# Patient Record
Sex: Female | Born: 1955
Health system: Southern US, Community
[De-identification: ages and names within clinical notes are randomized; demographics above are authoritative.]

## PROBLEM LIST (undated history)

## (undated) DIAGNOSIS — K219 Gastro-esophageal reflux disease without esophagitis: Secondary | ICD-10-CM

## (undated) DIAGNOSIS — F32A Depression, unspecified: Secondary | ICD-10-CM

## (undated) DIAGNOSIS — T8859XA Other complications of anesthesia, initial encounter: Secondary | ICD-10-CM

## (undated) DIAGNOSIS — Z9289 Personal history of other medical treatment: Secondary | ICD-10-CM

## (undated) DIAGNOSIS — Z9889 Other specified postprocedural states: Secondary | ICD-10-CM

## (undated) DIAGNOSIS — R112 Nausea with vomiting, unspecified: Secondary | ICD-10-CM

## (undated) DIAGNOSIS — M1990 Unspecified osteoarthritis, unspecified site: Secondary | ICD-10-CM

## (undated) DIAGNOSIS — G479 Sleep disorder, unspecified: Secondary | ICD-10-CM

## (undated) DIAGNOSIS — K8020 Calculus of gallbladder without cholecystitis without obstruction: Secondary | ICD-10-CM

## (undated) DIAGNOSIS — A6000 Herpesviral infection of urogenital system, unspecified: Secondary | ICD-10-CM

## (undated) DIAGNOSIS — I10 Essential (primary) hypertension: Secondary | ICD-10-CM

## (undated) DIAGNOSIS — F329 Major depressive disorder, single episode, unspecified: Secondary | ICD-10-CM

## (undated) DIAGNOSIS — T4145XA Adverse effect of unspecified anesthetic, initial encounter: Secondary | ICD-10-CM

## (undated) DIAGNOSIS — J45909 Unspecified asthma, uncomplicated: Secondary | ICD-10-CM

## (undated) HISTORY — PX: PARTIAL HIP ARTHROPLASTY: SHX733

## (undated) HISTORY — PX: OTHER SURGICAL HISTORY: SHX169

---

## 1998-08-03 ENCOUNTER — Other Ambulatory Visit: Admission: RE | Admit: 1998-08-03 | Discharge: 1998-08-03 | Payer: Self-pay | Admitting: Obstetrics and Gynecology

## 1998-08-16 ENCOUNTER — Other Ambulatory Visit: Admission: RE | Admit: 1998-08-16 | Discharge: 1998-08-16 | Payer: Self-pay | Admitting: Obstetrics and Gynecology

## 1998-08-22 ENCOUNTER — Inpatient Hospital Stay (HOSPITAL_COMMUNITY): Admission: RE | Admit: 1998-08-22 | Discharge: 1998-08-25 | Payer: Self-pay | Admitting: Obstetrics and Gynecology

## 1998-08-27 ENCOUNTER — Inpatient Hospital Stay (HOSPITAL_COMMUNITY): Admission: AD | Admit: 1998-08-27 | Discharge: 1998-08-27 | Payer: Self-pay | Admitting: Obstetrics and Gynecology

## 1998-08-27 ENCOUNTER — Encounter: Payer: Self-pay | Admitting: Obstetrics and Gynecology

## 1999-04-03 HISTORY — PX: ABDOMINAL HYSTERECTOMY: SHX81

## 2001-09-25 ENCOUNTER — Other Ambulatory Visit: Admission: RE | Admit: 2001-09-25 | Discharge: 2001-09-25 | Payer: Self-pay | Admitting: Obstetrics & Gynecology

## 2002-06-30 ENCOUNTER — Encounter: Payer: Self-pay | Admitting: Emergency Medicine

## 2002-06-30 ENCOUNTER — Emergency Department (HOSPITAL_COMMUNITY): Admission: EM | Admit: 2002-06-30 | Discharge: 2002-06-30 | Payer: Self-pay | Admitting: Emergency Medicine

## 2003-04-03 HISTORY — PX: KNEE ARTHROSCOPY: SHX127

## 2003-09-15 ENCOUNTER — Encounter (HOSPITAL_COMMUNITY): Admission: RE | Admit: 2003-09-15 | Discharge: 2003-12-14 | Payer: Self-pay | Admitting: Endocrinology

## 2003-11-08 ENCOUNTER — Emergency Department (HOSPITAL_COMMUNITY): Admission: EM | Admit: 2003-11-08 | Discharge: 2003-11-08 | Payer: Self-pay | Admitting: Emergency Medicine

## 2005-04-02 HISTORY — PX: JOINT REPLACEMENT: SHX530

## 2005-08-28 ENCOUNTER — Inpatient Hospital Stay (HOSPITAL_COMMUNITY): Admission: RE | Admit: 2005-08-28 | Discharge: 2005-08-31 | Payer: Self-pay | Admitting: Orthopedic Surgery

## 2008-07-02 ENCOUNTER — Emergency Department (HOSPITAL_COMMUNITY): Admission: EM | Admit: 2008-07-02 | Discharge: 2008-07-03 | Payer: Self-pay | Admitting: Emergency Medicine

## 2008-08-22 ENCOUNTER — Emergency Department (HOSPITAL_COMMUNITY): Admission: EM | Admit: 2008-08-22 | Discharge: 2008-08-22 | Payer: Self-pay | Admitting: Emergency Medicine

## 2009-05-24 ENCOUNTER — Ambulatory Visit (HOSPITAL_COMMUNITY): Admission: RE | Admit: 2009-05-24 | Discharge: 2009-05-24 | Payer: Self-pay | Admitting: Family Medicine

## 2010-05-31 ENCOUNTER — Other Ambulatory Visit (HOSPITAL_COMMUNITY): Payer: Self-pay | Admitting: Family Medicine

## 2010-08-18 NOTE — Op Note (Signed)
Natasha Hunter, Natasha Hunter             ACCOUNT NO.:  [id number]   MEDICAL RECORD NO.:  [id number]          PATIENT TYPE:  INP   LOCATION:  X002                         FACILITY:  [facility]   PHYSICIAN:  Madlyn Frankel. Charlann Boxer, M.D.  DATE OF BIRTH:  [birth year]   DATE OF PROCEDURE:  08/28/2005  DATE OF DISCHARGE:                                 OPERATIVE REPORT   PREOPERATIVE DIAGNOSIS:  Right knee osteoarthritis.   POSTOPERATIVE DIAGNOSIS:  Right knee osteoarthritis.   PROCEDURE:  Right total knee replacement.   COMPONENTS USED:  DePuy rotating platform posterior stabilized knee system  with a size 2-1/2 femur, sized 2-1/2 tibia, 10 mm posterior stabilized  rotating platform polyethylene insert, and a 35 patella button.   SURGEON:  Madlyn Frankel. Charlann Boxer, M.D.   ASSISTANT:  Cherly Beach.   ANESTHESIA:  Dermal, spinal plus MAC.   DRAINS:  One.   COMPLICATIONS:  None.   TOURNIQUET TIME:  55 minutes at 250 mmHg.   INDICATION FOR PROCEDURE:  Natasha Hunter 55 year old female who I have been  treating for degenerative joint disease  bilaterally right worse than left.  She has failed all conservative measures and wished to proceed with surgical  intervention.  We have discussed risks and benefits of DVT infection,  component failure, need for revision, stiffness, postoperative rehab  protocol, et Karie Soda.  Consent obtained.   PROCEDURE IN DETAIL:  The patient was brought to the operative theater.  Once adequate anesthesia and preoperative antibiotics, 1 gram of Ancef, were  administered, the patient was positioned supine.  The proximal thigh  tourniquet was placed on the right hip.  The right lower extremity was then  prepped and draped in sterile fashion following a pre-scrub.  The midline  incision was made followed by modified medial parapatellar arthrotomy of a  tri-vector approach.  The patella was subluxated not everted.  Knee exposure  was obtained routinely including decompression of the  joint with osteophyte  debridement.   Femur exposure was obtained; excision of the cruciate ligaments was carried  out.  Intramedullary canal was opened in guide position.  My guide hole was  a little more central, so I cut at 4 degrees of valgus 10 mm of bone off the  distal femur.   Following this resection, I sized the femur to be a size 2.5.  The rotation  of the femur matched the epicondylar axis and perpendicular white side of  the line.  Given this, a size 2-1/2 block was positioned and anterior,  posterior and chamfer cuts made.  Box cut was then made off the lateral  aspect of the distal femur.   At this point, attention was directed to the tibia.  Tibial exposure was  obtained routinely including meniscectomies.  The tibia was subluxated  anteriorly with retractors placed.  Utilizing an extramedullary device, I  resected 10 mm of bone off the proximal medial tibia.  This was the low  side; they are symmetric.   Spacer blocks were placed, and the knee came out to full extension.   The trial components were position, including 2-1/2 femur, 2-1/2  tibia with  10-mm spacer.  With the knee in 90 degrees of flexion, I passed an alignment  rod indicating that the tibial cut was perpendicular with the center of the  alignment rod passing through the center of the ankle.  The slope of the  tibial cut appeared to be appropriate as well, perpendicular to the shaft.   Given these parameters, the tibial rotation was marked with a fixed bearing  prosthesis with a 1.8 mm shim in place.   At this point, the trial components were removed.  The final components were  brought to the field.  The knee was copiously irrigated with normal saline  solution with pulse lavage.  It was dried.  I then injected the knee with 60  mL of Marcaine with epinephrine and Toradol.  Once the components were  ready, cement was mixed on the back table.  The components were then  cemented in position with the  tibia and femur.  The patella was then done.  A 10-mm spacer was placed and the knee brought out in extension.  Excess  cement was removed.  Once the cement was curing, I flexed the knee, removed  the poly, cleaned out the back to the knee of any fragmented cement or any  loose debris that was identified.  The final 10-mm posterior stabilized  rotating platform polyethylene insert was then placed.  The knee was reduced  and stable.   At this point, the knee was again irrigated with normal saline solution.  There was minimally hemostasis required at this point.  A medium Hemovac  drain was placed deep.  The extensor mechanism was closed with #1 Vicryl  with the knee flexed at 90 degrees.  The remainder of the wound was closed  in layers with Monocryl for the skin.  The knee was cleaned, dried and  dressed sterilely with Steri-Strips, dressing sponges, tape and a ball bulky  dressing.  The patient was transferred to the recovery room in stable  condition.      Madlyn Frankel Charlann Boxer, M.D.  Electronically Signed     MDO/MEDQ  D:  08/28/2005  T:  08/28/2005  Job:  [id number]

## 2010-08-18 NOTE — Discharge Summary (Signed)
NAMEKERRIE, Natasha Hunter             ACCOUNT NO.:  [id number]   MEDICAL RECORD NO.:  [id number]          PATIENT TYPE:  INP   LOCATION:  1508                         FACILITY:  [facility]   PHYSICIAN:  Madlyn Frankel. Charlann Boxer, M.D.  DATE OF BIRTH:  13-Jun-1955   DATE OF ADMISSION:  08/28/2005  DATE OF DISCHARGE:  08/31/2005                                 DISCHARGE SUMMARY   ADMITTING DIAGNOSES:  1.  Osteoarthritis of right knee.  2.  Hypertension.   DISCHARGE DIAGNOSES:  1.  Osteoarthritis of right knee.  2.  Hypertension.  3.  Mild postoperative anemia.   PROCEDURE:  On Aug 28, 2005, the patient underwent right total knee  replacement arthroplasty utilizing the DePuy rotating platform posterior  stabilizing knee system, D. Ashley Akin PA-C assisted.   BRIEF HISTORY:  This 55 year old lady with degenerative joint disease  bilaterally in her knees worse on the right than the left.  All conservative  measures have failed prior to this decision to go ahead with surgical  intervention.  Dr. Charlann Boxer discussed the risks and benefits of surgery and was  decided to go ahead with the above procedure.   HOSPITAL COURSE:  The patient tolerated surgical procedure quite well and  did well with her physical therapy.  Weightbearing as tolerated.  Wound  remained clean and dry after surgery.  There was no critical incidents while  she was in the hospital well.  Home health was arranged and once this was in  place, it was felt she could be maintain in her home environment.  She was  discharged.   LABORATORY DATA AND X-RAY FINDINGS:  Laboratory values in the hospital  hematologically showed a preoperative CBC completely within normal limits,  hemoglobin 14.6, hematocrit 42.3.  Postoperatively, her final hemoglobin was  10.6, hematocrit 31.0.  Blood chemistries were normal.  Urinalysis negative  for urinary tract infection.   Electrocardiogram was sinus tachycardia with possible left atrial  enlargement.  No  chest x-ray seen on this chart.   CONDITION ON DISCHARGE:  Improved and stable.   DISPOSITION:  The patient is discharged to her home.  Continue with  weightbearing as tolerated.  Follow health instructions.  May shower in 4  days.   DISCHARGE MEDICATIONS:  1.  Vicodin for discomfort.  2.  Flexeril for muscle relaxant.  3.  One aspirin per day.   FOLLOW UP:  She will eventually go to outpatient physical therapy at  [facility].  She has actually decided this rather than home health and  we will certainly comply.      Dooley L. Cherlynn June.      Madlyn Frankel Charlann Boxer, M.D.  Electronically Signed    DLU/MEDQ  D:  09/11/2005  T:  09/11/2005  Job:  [id number]   cc:   Duncan Dull, M.D.  Fax: [phone]

## 2010-08-18 NOTE — H&P (Signed)
Natasha Hunter, Natasha Hunter             ACCOUNT NO.:  [id number]   MEDICAL RECORD NO.:  [id number]          PATIENT TYPE:  INP   LOCATION:  NA                           FACILITY:  [facility]   PHYSICIAN:  Natasha Hunter, M.D.  DATE OF BIRTH:  29-Sep-1955   DATE OF ADMISSION:  DATE OF DISCHARGE:                                HISTORY & PHYSICAL   DATE OF ADMISSION TO [facility]:  Aug 28, 2005   CHIEF COMPLAINT:  Pain in my right knee.   PRESENT ILLNESS:  This 55 year old female seen by [place] for concerns about her  right knee.  In November 2002 she was doing a exercise program in a gym and  felt a pop in her knee.  As she understood her diagnosis at that time, she  had an ACL tear and underwent surgery in 2003.  She recovered fairly well  from that, had on and off increasing pain into the knee; multiple  aspirations of the knee were done as well.  She is now having more and more  problems with the knee to the point where it is interfering with her day-to-  day activities.  X-rays have shown advanced degenerative changes to the  lateral compartment of the knee.  Anti-inflammatories have been used to help  her continuing knee pain as well as intra-articular cortisone injections.  Ice has been used as well.  Due to the fact that she has continued with  progressive problems concerning the knee and is a very active lady, it is  felt that she would benefit with surgical intervention and after the risks  and benefits of surgery been described to the patient, she agreed to go  ahead with total knee replacement arthroplasty of the right knee.   PAST MEDICAL HISTORY:  The patient is allergic to SULFA and has  hallucinations on DARVOCET.  Dr. Shaune Pollack is her primary care physician.   Current medications:  1.  Amitriptyline 10 mg a day.  2.  Hydrochlorothiazide 25 mg a day.  3.  Cartia 240 mg a day.  4.  Clonidine 0.1 mg a day.  5.  Lisinopril 40 mg daily.   She is being treated for  hypertension, some mild depression, and has a  remote history of asthma.  Past surgeries have been right knee surgeries as  mentioned above, hysterectomy in 2009.   FAMILY HISTORY:  Positive for cancer in her family.   SOCIAL HISTORY:  She is single.  She is a Airline pilot support person, has no  intake of tobacco products.  Has about a pint of alcohol a month, if that.  She has three children, lives in an apartment on the first floor, has no  definitive caregiver after surgery.   REVIEW OF SYSTEMS:  CNS:  No seizure disorder, paralysis, numbness, double  vision.  RESPIRATORY:  No productive cough, no hemoptysis, no shortness of  breath.  CARDIOVASCULAR:  No chest pain, no angina or orthopnea.  GASTROINTESTINAL:  No nausea, vomiting, melena, or bloody stools.  GENITOURINARY:  No discharge, dysuria, hematuria.  MUSCULOSKELETAL:  Primarily in present illness.  PHYSICAL EXAMINATION:  GENERAL:  Alert and cooperative, friendly 55 year old  female.  VITAL SIGNS:  Blood pressure 138/78, pulse 72, respirations 10.  HEENT:  Normocephalic, PERRLA, oropharynx is clear.  CHEST:  Clear to auscultation, no rhonchi, no rales.  HEART:  Regular rate and rhythm.  No murmurs are heard.  ABDOMEN:  Soft, nontender.  Liver and spleen not felt.  GENITALIA, RECTAL, PELVIC, BREAST:  Not done, not pertinent to present  illness.  EXTREMITIES:  Painful range of motion of the right knee with crepitus.   ADMISSION DIAGNOSES:  1.  Osteoarthritis right knee.  2.  Hypertension.   PLAN:  The patient will undergo right total knee replacement arthroplasty.  We will use CPM postoperatively and plan for home health.  Should we have  any medical problems we will certainly call Dr. Shaune Pollack or one of her  associates to follow along with [place] during this patient's hospitalization.   Note:  This history and physical was performed in our office on Aug 13, 2005.      Dooley L. Cherlynn June.      Natasha Frankel Charlann Hunter,  M.D.  Electronically Signed    DLU/MEDQ  D:  08/15/2005  T:  08/15/2005  Job:  [id number]   cc:   Duncan Dull, M.D.  Fax: [phone]

## 2010-09-05 ENCOUNTER — Other Ambulatory Visit (HOSPITAL_COMMUNITY): Payer: Self-pay | Admitting: Family Medicine

## 2010-09-05 DIAGNOSIS — Z1231 Encounter for screening mammogram for malignant neoplasm of breast: Secondary | ICD-10-CM

## 2010-09-14 ENCOUNTER — Ambulatory Visit (HOSPITAL_COMMUNITY)
Admission: RE | Admit: 2010-09-14 | Discharge: 2010-09-14 | Disposition: A | Discharge status: Home / Self Care | Payer: Self-pay | Source: Ambulatory Visit | Attending: Family Medicine | Admitting: Family Medicine

## 2010-09-14 DIAGNOSIS — Z1231 Encounter for screening mammogram for malignant neoplasm of breast: Secondary | ICD-10-CM

## 2011-08-31 ENCOUNTER — Other Ambulatory Visit (HOSPITAL_COMMUNITY): Payer: Self-pay | Admitting: Family Medicine

## 2011-12-27 ENCOUNTER — Other Ambulatory Visit (HOSPITAL_COMMUNITY): Payer: Self-pay | Admitting: Family Medicine

## 2011-12-27 DIAGNOSIS — Z1231 Encounter for screening mammogram for malignant neoplasm of breast: Secondary | ICD-10-CM

## 2012-01-11 ENCOUNTER — Ambulatory Visit (HOSPITAL_COMMUNITY)
Admission: RE | Admit: 2012-01-11 | Discharge: 2012-01-11 | Disposition: A | Discharge status: Home / Self Care | Payer: Self-pay | Source: Ambulatory Visit | Attending: Family Medicine | Admitting: Family Medicine

## 2012-01-11 DIAGNOSIS — Z1231 Encounter for screening mammogram for malignant neoplasm of breast: Secondary | ICD-10-CM

## 2012-11-17 ENCOUNTER — Inpatient Hospital Stay: Admit: 2012-11-17 | Payer: Self-pay | Admitting: Orthopedic Surgery

## 2012-11-17 SURGERY — ARTHROPLASTY, KNEE, TOTAL
Anesthesia: Spinal | Site: Knee | Laterality: Left

## 2012-12-09 ENCOUNTER — Other Ambulatory Visit (HOSPITAL_COMMUNITY): Payer: Self-pay | Admitting: Family Medicine

## 2012-12-09 DIAGNOSIS — Z1231 Encounter for screening mammogram for malignant neoplasm of breast: Secondary | ICD-10-CM

## 2013-01-12 ENCOUNTER — Ambulatory Visit (HOSPITAL_COMMUNITY)
Admission: RE | Admit: 2013-01-12 | Discharge: 2013-01-12 | Disposition: A | Discharge status: Home / Self Care | Payer: Self-pay | Source: Ambulatory Visit | Attending: Family Medicine | Admitting: Family Medicine

## 2013-01-12 DIAGNOSIS — Z1231 Encounter for screening mammogram for malignant neoplasm of breast: Secondary | ICD-10-CM

## 2014-01-14 ENCOUNTER — Other Ambulatory Visit (HOSPITAL_COMMUNITY): Payer: Self-pay | Admitting: Family Medicine

## 2014-01-14 DIAGNOSIS — Z1231 Encounter for screening mammogram for malignant neoplasm of breast: Secondary | ICD-10-CM

## 2014-01-27 ENCOUNTER — Ambulatory Visit (HOSPITAL_COMMUNITY)
Admission: RE | Admit: 2014-01-27 | Discharge: 2014-01-27 | Disposition: A | Discharge status: Home / Self Care | Payer: BC Managed Care – PPO | Source: Ambulatory Visit | Attending: Family Medicine | Admitting: Family Medicine

## 2014-01-27 DIAGNOSIS — Z1231 Encounter for screening mammogram for malignant neoplasm of breast: Secondary | ICD-10-CM | POA: Insufficient documentation

## 2014-05-04 NOTE — Progress Notes (Signed)
Please put orders in Epic surgery 05-17-14 pre op 05-10-14 Thanks 

## 2014-05-07 NOTE — Patient Instructions (Addendum)
Natasha HarrisonDeborah A Hunter  05/07/2014   Your procedure is scheduled on: 05/17/14   [facility] and follow signs to               Short Stay Center at 10:15 AM.   Call this number if you have problems the morning of surgery [phone]   Remember:  Do not eat food or drink liquids :After Midnight.     Take these medicines the morning of surgery with A SIP OF WATER: ATENOLOL / CLONIDINE                               You may not have any metal on your body including hair pins and              piercings  Do not wear jewelry, make-up, lotions, powders or perfumes.             Do not wear nail polish.  Do not shave  48 hours prior to surgery.              Men may shave face and neck.   Do not bring valuables to the hospital. [name] IS NOT             RESPONSIBLE   FOR VALUABLES.  Contacts, dentures or bridgework may not be worn into surgery.  Leave suitcase in the car. After surgery it may be brought to your room.     Patients discharged the day of surgery will not be allowed to drive home.  Name and phone number of your driver:  Special Instructions: N/A              Please read over the following fact sheets you were given: _____________________________________________________________________                                                     [name] - PREPARING FOR SURGERY  Before surgery, you can play an important role.  Because skin is not sterile, your skin needs to be as free of germs as possible.  You can reduce the number of germs on your skin by washing with CHG (chlorahexidine gluconate) soap before surgery.  CHG is an antiseptic cleaner which kills germs and bonds with the skin to continue killing germs even after washing. Please DO NOT use if you have an allergy to CHG or antibacterial soaps.  If your skin becomes reddened/irritated stop using the CHG and inform your nurse when you arrive at Short Stay. Do not shave  (including legs and underarms) for at least 48 hours prior to the first CHG shower.  You may shave your face. Please follow these instructions carefully:   1.  Shower with CHG Soap the night before surgery and the  morning of Surgery.   2.  If you choose to wash your hair, wash your hair first as usual with your  normal  Shampoo.   3.  After you shampoo, rinse your hair and body thoroughly to remove the  shampoo.  4.  Use CHG as you would any other liquid soap.  You can apply chg directly  to the skin and wash . Gently wash with scrungie or clean wascloth    5.  Apply the CHG Soap to your body ONLY FROM THE NECK DOWN.   Do not use on open                           Wound or open sores. Avoid contact with eyes, ears mouth and genitals (private parts).                        Genitals (private parts) with your normal soap.              6.  Wash thoroughly, paying special attention to the area where your surgery  will be performed.   7.  Thoroughly rinse your body with warm water from the neck down.   8.  DO NOT shower/wash with your normal soap after using and rinsing off  the CHG Soap .                9.  Pat yourself dry with a clean towel.             10.  Wear clean pajamas.             11.  Place clean sheets on your bed the night of your first shower and do not  sleep with pets.  Day of Surgery : Do not apply any lotions/deodorants the morning of surgery.  Please wear clean clothes to the hospital/surgery center.  FAILURE TO FOLLOW THESE INSTRUCTIONS MAY RESULT IN THE CANCELLATION OF YOUR SURGERY    PATIENT SIGNATURE_________________________________  ______________________________________________________________________     Natasha Hunter  An incentive spirometer is a tool that can help keep your lungs clear and active. This tool measures how well you are filling your lungs with each breath. Taking long deep breaths may help  reverse or decrease the chance of developing breathing (pulmonary) problems (especially infection) following:  A long period of time when you are unable to move or be active. BEFORE THE PROCEDURE   If the spirometer includes an indicator to show your best effort, your nurse or respiratory therapist will set it to a desired goal.  If possible, sit up straight or lean slightly forward. Try not to slouch.  Hold the incentive spirometer in an upright position. INSTRUCTIONS FOR USE   Sit on the edge of your bed if possible, or sit up as far as you can in bed or on a chair.  Hold the incentive spirometer in an upright position.  Breathe out normally.  Place the mouthpiece in your mouth and seal your lips tightly around it.  Breathe in slowly and as deeply as possible, raising the piston or the ball toward the top of the column.  Hold your breath for 3-5 seconds or for as long as possible. Allow the piston or ball to fall to the bottom of the column.  Remove the mouthpiece from your mouth and breathe out normally.  Rest for a few seconds and repeat Steps 1 through 7 at least 10 times every 1-2 hours when you are awake. Take your time and take a few normal breaths between deep breaths.  The spirometer may include an indicator to show your best effort. Use the indicator as a goal to work toward during  each repetition.  After each set of 10 deep breaths, practice coughing to be sure your lungs are clear. If you have an incision (the cut made at the time of surgery), support your incision when coughing by placing a pillow or rolled up towels firmly against it. Once you are able to get out of bed, walk around indoors and cough well. You may stop using the incentive spirometer when instructed by your caregiver.  RISKS AND COMPLICATIONS  Take your time so you do not get dizzy or light-headed.  If you are in pain, you may need to take or ask for pain medication before doing incentive spirometry.  It is harder to take a deep breath if you are having pain. AFTER USE  Rest and breathe slowly and easily.  It can be helpful to keep track of a log of your progress. Your caregiver can provide you with a simple table to help with this. If you are using the spirometer at home, follow these instructions: [place] IF:   You are having difficultly using the spirometer.  You have trouble using the spirometer as often as instructed.  Your pain medication is not giving enough relief while using the spirometer.  You develop fever of 100.5 F (38.1 C) or higher. SEEK IMMEDIATE MEDICAL CARE IF:   You cough up bloody sputum that had not been present before.  You develop fever of 102 F (38.9 C) or greater.  You develop worsening pain at or near the incision site. MAKE SURE YOU:   Understand these instructions.  Will watch your condition.  Will get help right away if you are not doing well or get worse. Document Released: 07/30/2006 Document Revised: 06/11/2011 Document Reviewed: 09/30/2006 Exit[employer] Patient Information 2014 Exit[employer], [place].   ________________________________________________________________________  WHAT IS A BLOOD TRANSFUSION? Blood Transfusion Information  A transfusion is the replacement of blood or some of its parts. Blood is made up of multiple cells which provide different functions.  Red blood cells carry oxygen and are used for blood loss replacement.  White blood cells fight against infection.  Platelets control bleeding.  Plasma helps clot blood.  Other blood products are available for specialized needs, such as hemophilia or other clotting disorders. BEFORE THE TRANSFUSION  Who gives blood for transfusions?   Healthy volunteers who are fully evaluated to make sure their blood is safe. This is blood bank blood. Transfusion therapy is the safest it has ever been in the practice of medicine. Before blood is taken from a donor, a complete  history is taken to make sure that person has no history of diseases nor engages in risky social behavior (examples are intravenous drug use or sexual activity with multiple partners). The donor's travel history is screened to minimize risk of transmitting infections, such as malaria. The donated blood is tested for signs of infectious diseases, such as HIV and hepatitis. The blood is then tested to be sure it is compatible with you in order to minimize the chance of a transfusion reaction. If you or a relative donates blood, this is often done in anticipation of surgery and is not appropriate for emergency situations. It takes many days to process the donated blood. RISKS AND COMPLICATIONS Although transfusion therapy is very safe and saves many lives, the main dangers of transfusion include:   Getting an infectious disease.  Developing a transfusion reaction. This is an allergic reaction to something in the blood you were given. Every precaution is taken to prevent  this. The decision to have a blood transfusion has been considered carefully by your caregiver before blood is given. Blood is not given unless the benefits outweigh the risks. AFTER THE TRANSFUSION  Right after receiving a blood transfusion, you will usually feel much better and more energetic. This is especially true if your red blood cells have gotten low (anemic). The transfusion raises the level of the red blood cells which carry oxygen, and this usually causes an energy increase.  The nurse administering the transfusion will monitor you carefully for complications. HOME CARE INSTRUCTIONS  No special instructions are needed after a transfusion. You may find your energy is better. Speak with your caregiver about any limitations on activity for underlying diseases you may have. SEEK MEDICAL CARE IF:   Your condition is not improving after your transfusion.  You develop redness or irritation at the intravenous (IV) site. SEEK  IMMEDIATE MEDICAL CARE IF:  Any of the following symptoms occur over the next 12 hours:  Shaking chills.  You have a temperature by mouth above 102 F (38.9 C), not controlled by medicine.  Chest, back, or muscle pain.  People around you feel you are not acting correctly or are confused.  Shortness of breath or difficulty breathing.  Dizziness and fainting.  You get a rash or develop hives.  You have a decrease in urine output.  Your urine turns a dark color or changes to pink, red, or brown. Any of the following symptoms occur over the next 10 days:  You have a temperature by mouth above 102 F (38.9 C), not controlled by medicine.  Shortness of breath.  Weakness after normal activity.  The white part of the eye turns yellow (jaundice).  You have a decrease in the amount of urine or are urinating less often.  Your urine turns a dark color or changes to pink, red, or brown. Document Released: 03/16/2000 Document Revised: 06/11/2011 Document Reviewed: 11/03/2007 [facility] Patient Information 2014 [place], [place].  _______________________________________________________________________

## 2014-05-10 ENCOUNTER — Encounter (HOSPITAL_COMMUNITY): Payer: Self-pay

## 2014-05-10 ENCOUNTER — Encounter (HOSPITAL_COMMUNITY)
Admission: RE | Admit: 2014-05-10 | Discharge: 2014-05-10 | Disposition: A | Discharge status: Home / Self Care | Payer: BLUE CROSS/BLUE SHIELD | Source: Ambulatory Visit | Attending: Orthopedic Surgery | Admitting: Orthopedic Surgery

## 2014-05-10 DIAGNOSIS — M179 Osteoarthritis of knee, unspecified: Secondary | ICD-10-CM | POA: Diagnosis not present

## 2014-05-10 DIAGNOSIS — Z01818 Encounter for other preprocedural examination: Secondary | ICD-10-CM | POA: Insufficient documentation

## 2014-05-10 HISTORY — DX: Other complications of anesthesia, initial encounter: T88.59XA

## 2014-05-10 HISTORY — DX: Sleep disorder, unspecified: G47.9

## 2014-05-10 HISTORY — DX: Nausea with vomiting, unspecified: R11.2

## 2014-05-10 HISTORY — DX: Unspecified asthma, uncomplicated: J45.909

## 2014-05-10 HISTORY — DX: Personal history of other medical treatment: Z92.89

## 2014-05-10 HISTORY — DX: Depression, unspecified: F32.A

## 2014-05-10 HISTORY — DX: Unspecified osteoarthritis, unspecified site: M19.90

## 2014-05-10 HISTORY — DX: Herpesviral infection of urogenital system, unspecified: A60.00

## 2014-05-10 HISTORY — DX: Adverse effect of unspecified anesthetic, initial encounter: T41.45XA

## 2014-05-10 HISTORY — DX: Major depressive disorder, single episode, unspecified: F32.9

## 2014-05-10 HISTORY — DX: Nausea with vomiting, unspecified: Z98.890

## 2014-05-10 HISTORY — DX: Essential (primary) hypertension: I10

## 2014-05-10 LAB — BASIC METABOLIC PANEL
Anion gap: 7 (ref 5–15)
BUN: 18 mg/dL (ref 6–23)
CALCIUM: 9.2 mg/dL (ref 8.4–10.5)
CHLORIDE: 102 mmol/L (ref 96–112)
CO2: 29 mmol/L (ref 19–32)
CREATININE: 0.95 mg/dL (ref 0.50–1.10)
GFR calc Af Amer: 75 mL/min — ABNORMAL LOW (ref >90)
GFR, EST NON AFRICAN AMERICAN: 65 mL/min — AB (ref >90)
Glucose, Bld: 89 mg/dL (ref 70–99)
POTASSIUM: 3.9 mmol/L (ref 3.5–5.1)
Sodium: 138 mmol/L (ref 135–145)

## 2014-05-10 LAB — CBC
HEMATOCRIT: 39.9 % (ref 36.0–46.0)
Hemoglobin: 13.1 g/dL (ref 12.0–15.0)
MCH: 30.3 pg (ref 26.0–34.0)
MCHC: 32.8 g/dL (ref 30.0–36.0)
MCV: 92.1 fL (ref 78.0–100.0)
Platelets: 250 10*3/uL (ref 150–400)
RBC: 4.33 MIL/uL (ref 3.87–5.11)
RDW: 12.7 % (ref 11.5–15.5)
WBC: 7.7 10*3/uL (ref 4.0–10.5)

## 2014-05-10 LAB — URINALYSIS, ROUTINE W REFLEX MICROSCOPIC
BILIRUBIN URINE: NEGATIVE
GLUCOSE, UA: NEGATIVE mg/dL
Ketones, ur: NEGATIVE mg/dL
LEUKOCYTES UA: NEGATIVE
Nitrite: NEGATIVE
PH: 5.5 (ref 5.0–8.0)
Protein, ur: NEGATIVE mg/dL
SPECIFIC GRAVITY, URINE: 1.015 (ref 1.005–1.030)
UROBILINOGEN UA: 0.2 mg/dL (ref 0.0–1.0)

## 2014-05-10 LAB — SURGICAL PCR SCREEN
MRSA, PCR: INVALID — AB
STAPHYLOCOCCUS AUREUS: INVALID — AB

## 2014-05-10 LAB — URINE MICROSCOPIC-ADD ON

## 2014-05-10 LAB — APTT: aPTT: 31 seconds (ref 24–37)

## 2014-05-10 LAB — PROTIME-INR
INR: 1.04 (ref 0.00–1.49)
PROTHROMBIN TIME: 13.8 s (ref 11.6–15.2)

## 2014-05-10 NOTE — Progress Notes (Signed)
Micro, ua results faxed to dr Charlann Boxerolin office by epic

## 2014-05-12 NOTE — H&P (Signed)
TOTAL KNEE ADMISSION H&P  Patient is being admitted for left total knee arthroplasty.  Subjective:  Chief Complaint:    Left knee primary OA /pain.  HPI: Natasha Hunter, 59 y.o. female, has a history of pain and functional disability in the left knee due to arthritis and has failed non-surgical conservative treatments for greater than 12 weeks.  She has had a history of right total knee arthroplasty three or four years ago that she has done well with. She recognizes at this point that her left knee is persistently bothersome and at this point ready to proceed with total knee arthroplasty. She has no desire to continue conservative measures based on the persistence of her symptoms.  On examination, she is seen and evaluated in the office. Today, she is a very pleasant female in no acute distress. She walks with a slight limp favoring this left knee with a valgus tendency of the left knee with no significant fixed flexion or valgus deformity. Her right knee incision is healed with full knee extension and flexion. She is tolerating this with stable ligaments with no signs of infection.  Radiographs in the office today, an AP standing of both knees and a standing lateral of the left knee reveal advanced degenerative joint changes in the left knee with complete loss of joint space and patellofemoral lateral compartments with tricompartment osteophytes.  Based on her persistent discomfort and lack of response to previous treatment, she desires at this point to proceed with total knee arthroplasty. I reviewed the risks and benefits of the procedure, the postoperative course, and expectations of this as she has been through this in the past. Surgery will be scheduled through [facility] based on her request. We will see her in the postoperative period. We stressed the postoperative recovery and she is aware of this based on her previous surgery.   PCP: Hollice Espy, MD  D/C Plans:      Home  with HHPT  Post-op Meds:       No Rx given  Tranexamic Acid:      To be given - IV  Decadron:      Is to be given  FYI:     ASA post-op  Norco post-op     Past Medical History  Diagnosis Date  . Complication of anesthesia   . PONV (postoperative nausea and vomiting)   . Hypertension   . Asthma     INFFREQUENT PROBLEMS - DOES NOT HAVE INHALER  . Arthritis   . Genital herpes   . History of transfusion   . Difficulty sleeping   . Depression     Past Surgical History  Procedure Laterality Date  . Knee arthroscopy  2005    rt knee  . Joint replacement  2007    RT KNEE  . Abdominal hysterectomy  2001  . Aborations      X2    No prescriptions prior to admission   Allergies  Allergen Reactions  . Darvon [Propoxyphene]     hallucinations  . Sulfa Antibiotics Other (See Comments)    Large welps    History  Substance Use Topics  . Smoking status: Never Smoker   . Smokeless tobacco: Not on file  . Alcohol Use: No    No family history on file.   Review of Systems  Constitutional: Negative.   HENT: Negative.   Eyes: Negative.   Respiratory: Negative.   Cardiovascular: Negative.   Gastrointestinal: Negative.   Genitourinary: Negative.  Musculoskeletal: Positive for joint pain.  Skin: Negative.   Neurological: Negative.   Endo/Heme/Allergies: Negative.   Psychiatric/Behavioral: Positive for depression. The patient has insomnia.     Objective:  Physical Exam  Constitutional: She is oriented to person, place, and time. She appears well-developed and well-nourished.  HENT:  Head: Normocephalic and atraumatic.  Eyes: Pupils are equal, round, and reactive to light.  Neck: Neck supple. No JVD present. No tracheal deviation present. No thyromegaly present.  Cardiovascular: Normal rate, regular rhythm, normal heart sounds and intact distal pulses.   Respiratory: Effort normal and breath sounds normal. No stridor. No respiratory distress. She has no wheezes.  GI:  Soft. There is no tenderness. There is no guarding.  Musculoskeletal:       Left knee: She exhibits decreased range of motion, swelling and bony tenderness. She exhibits no ecchymosis, no deformity, no laceration and no erythema. Tenderness found.  Lymphadenopathy:    She has no cervical adenopathy.  Neurological: She is alert and oriented to person, place, and time.  Skin: Skin is warm and dry.  Psychiatric: She has a normal mood and affect.      Imaging Review Plain radiographs demonstrate severe degenerative joint disease of the left knee(s). The overall alignment is neutral. The bone quality appears to be good for age and reported activity level.  Assessment/Plan:  End stage arthritis, left knee   The patient history, physical examination, clinical judgment of the provider and imaging studies are consistent with end stage degenerative joint disease of the left knee(s) and total knee arthroplasty is deemed medically necessary. The treatment options including medical management, injection therapy arthroscopy and arthroplasty were discussed at length. The risks and benefits of total knee arthroplasty were presented and reviewed. The risks due to aseptic loosening, infection, stiffness, patella tracking problems, thromboembolic complications and other imponderables were discussed. The patient acknowledged the explanation, agreed to proceed with the plan and consent was signed. Patient is being admitted for inpatient treatment for surgery, pain control, PT, OT, prophylactic antibiotics, VTE prophylaxis, progressive ambulation and ADL's and discharge planning. The patient is planning to be discharged home with home health services.     Anastasio AuerbachMatthew S. Yulian Gosney   PA-C  05/12/2014, 9:42 PM

## 2014-05-13 LAB — MRSA CULTURE

## 2014-05-15 NOTE — Anesthesia Preprocedure Evaluation (Signed)
Anesthesia Evaluation  Patient identified by MRN, date of birth, ID band Patient awake    Reviewed: Allergy & Precautions, NPO status , Patient's Chart, lab work & pertinent test results  History of Anesthesia Complications (+) history of anesthetic complications  Airway Mallampati: II  TM Distance: >3 FB Neck ROM: Full    Dental no notable dental hx. (+) Dental Advisory Given   Pulmonary asthma ,  breath sounds clear to auscultation  Pulmonary exam normal       Cardiovascular hypertension, Pt. on medications Rhythm:Regular Rate:Normal     Neuro/Psych PSYCHIATRIC DISORDERS Anxiety Depression negative neurological ROS     GI/Hepatic negative GI ROS, Neg liver ROS,   Endo/Other  negative endocrine ROS  Renal/GU negative Renal ROS  negative genitourinary   Musculoskeletal  (+) Arthritis -, Osteoarthritis,    Abdominal   Peds negative pediatric ROS (+)  Hematology negative hematology ROS (+)   Anesthesia Other Findings   Reproductive/Obstetrics negative OB ROS                             Anesthesia Physical Anesthesia Plan  ASA: II  Anesthesia Plan: Spinal   Post-op Pain Management:    Induction:   Airway Management Planned:   Additional Equipment:   Intra-op Plan:   Post-operative Plan:   Informed Consent: I have reviewed the patients History and Physical, chart, labs and discussed the procedure including the risks, benefits and alternatives for the proposed anesthesia with the patient or authorized representative who has indicated his/her understanding and acceptance.   Dental advisory given  Plan Discussed with: CRNA  Anesthesia Plan Comments:         Anesthesia Quick Evaluation

## 2014-05-17 ENCOUNTER — Encounter (HOSPITAL_COMMUNITY)
Admission: RE | Disposition: A | Discharge status: Home Health | Payer: Self-pay | Source: Ambulatory Visit | Attending: Orthopedic Surgery

## 2014-05-17 ENCOUNTER — Inpatient Hospital Stay (HOSPITAL_COMMUNITY): Payer: BLUE CROSS/BLUE SHIELD | Admitting: Anesthesiology

## 2014-05-17 ENCOUNTER — Inpatient Hospital Stay (HOSPITAL_COMMUNITY)
Admission: RE | Admit: 2014-05-17 | Discharge: 2014-05-18 | DRG: 470 | Disposition: A | Discharge status: Home Health | Payer: BLUE CROSS/BLUE SHIELD | Source: Ambulatory Visit | Attending: Orthopedic Surgery | Admitting: Orthopedic Surgery

## 2014-05-17 ENCOUNTER — Encounter (HOSPITAL_COMMUNITY): Payer: Self-pay | Admitting: *Deleted

## 2014-05-17 DIAGNOSIS — Z01812 Encounter for preprocedural laboratory examination: Secondary | ICD-10-CM

## 2014-05-17 DIAGNOSIS — M659 Synovitis and tenosynovitis, unspecified: Secondary | ICD-10-CM | POA: Diagnosis present

## 2014-05-17 DIAGNOSIS — Z96659 Presence of unspecified artificial knee joint: Secondary | ICD-10-CM

## 2014-05-17 DIAGNOSIS — M1712 Unilateral primary osteoarthritis, left knee: Principal | ICD-10-CM | POA: Diagnosis present

## 2014-05-17 DIAGNOSIS — Z96651 Presence of right artificial knee joint: Secondary | ICD-10-CM | POA: Diagnosis present

## 2014-05-17 DIAGNOSIS — Z96652 Presence of left artificial knee joint: Secondary | ICD-10-CM

## 2014-05-17 DIAGNOSIS — I10 Essential (primary) hypertension: Secondary | ICD-10-CM | POA: Diagnosis present

## 2014-05-17 DIAGNOSIS — M25562 Pain in left knee: Secondary | ICD-10-CM | POA: Diagnosis present

## 2014-05-17 HISTORY — PX: TOTAL KNEE ARTHROPLASTY: SHX125

## 2014-05-17 LAB — TYPE AND SCREEN
ABO/RH(D): A POS
ANTIBODY SCREEN: NEGATIVE

## 2014-05-17 SURGERY — ARTHROPLASTY, KNEE, TOTAL
Anesthesia: Spinal | Site: Knee | Laterality: Left

## 2014-05-17 MED ORDER — SODIUM CHLORIDE 0.9 % IJ SOLN
INTRAMUSCULAR | Status: DC | PRN
  Administered 2014-05-17: 30 mL

## 2014-05-17 MED ORDER — PROPOFOL 10 MG/ML IV BOLUS
INTRAVENOUS | Status: AC
  Filled 2014-05-17: qty 20

## 2014-05-17 MED ORDER — DEXAMETHASONE SODIUM PHOSPHATE 10 MG/ML IJ SOLN
INTRAMUSCULAR | Status: AC
  Filled 2014-05-17: qty 1

## 2014-05-17 MED ORDER — SODIUM CHLORIDE 0.9 % IR SOLN
Status: DC | PRN
  Administered 2014-05-17: 1000 mL

## 2014-05-17 MED ORDER — FENTANYL CITRATE 0.05 MG/ML IJ SOLN
INTRAMUSCULAR | Status: AC
  Filled 2014-05-17: qty 2

## 2014-05-17 MED ORDER — HYDROMORPHONE HCL 1 MG/ML IJ SOLN
0.5000 mg | INTRAMUSCULAR | Status: DC | PRN
  Administered 2014-05-17 – 2014-05-18 (×4): 1 mg via INTRAVENOUS
  Filled 2014-05-17: qty 2
  Filled 2014-05-17 (×3): qty 1

## 2014-05-17 MED ORDER — ONDANSETRON HCL 4 MG/2ML IJ SOLN: 4.0000 mg | Freq: Once | INTRAMUSCULAR | Status: DC | PRN

## 2014-05-17 MED ORDER — HYDROMORPHONE HCL 1 MG/ML IJ SOLN
INTRAMUSCULAR | Status: AC
  Filled 2014-05-17: qty 1

## 2014-05-17 MED ORDER — MENTHOL 3 MG MT LOZG
1.0000 | LOZENGE | OROMUCOSAL | Status: DC | PRN
  Filled 2014-05-17: qty 9

## 2014-05-17 MED ORDER — CEFAZOLIN SODIUM-DEXTROSE 2-3 GM-% IV SOLR
2.0000 g | INTRAVENOUS | Status: AC
  Administered 2014-05-17: 2 g via INTRAVENOUS

## 2014-05-17 MED ORDER — BISACODYL 10 MG RE SUPP: 10.0000 mg | Freq: Every day | RECTAL | Status: DC | PRN

## 2014-05-17 MED ORDER — CEFAZOLIN SODIUM-DEXTROSE 2-3 GM-% IV SOLR
INTRAVENOUS | Status: AC
  Filled 2014-05-17: qty 50

## 2014-05-17 MED ORDER — ASPIRIN EC 325 MG PO TBEC
325.0000 mg | DELAYED_RELEASE_TABLET | Freq: Two times a day (BID) | ORAL | Status: DC
  Administered 2014-05-18: 325 mg via ORAL
  Filled 2014-05-17 (×3): qty 1

## 2014-05-17 MED ORDER — 0.9 % SODIUM CHLORIDE (POUR BTL) OPTIME
TOPICAL | Status: DC | PRN
  Administered 2014-05-17: 1000 mL

## 2014-05-17 MED ORDER — ACETAMINOPHEN 10 MG/ML IV SOLN
1000.0000 mg | Freq: Once | INTRAVENOUS | Status: AC
  Administered 2014-05-17: 1000 mg via INTRAVENOUS
  Filled 2014-05-17: qty 100

## 2014-05-17 MED ORDER — TRAZODONE HCL 50 MG PO TABS
50.0000 mg | ORAL_TABLET | Freq: Every evening | ORAL | Status: DC | PRN
  Administered 2014-05-17: 150 mg via ORAL
  Filled 2014-05-17: qty 3

## 2014-05-17 MED ORDER — METOCLOPRAMIDE HCL 5 MG/ML IJ SOLN: 5.0000 mg | Freq: Three times a day (TID) | INTRAMUSCULAR | Status: DC | PRN

## 2014-05-17 MED ORDER — ONDANSETRON HCL 4 MG/2ML IJ SOLN
INTRAMUSCULAR | Status: DC | PRN
  Administered 2014-05-17: 4 mg via INTRAVENOUS

## 2014-05-17 MED ORDER — FENTANYL CITRATE 0.05 MG/ML IJ SOLN
25.0000 ug | INTRAMUSCULAR | Status: DC | PRN
  Administered 2014-05-17: 50 ug via INTRAVENOUS
  Administered 2014-05-17: 25 ug via INTRAVENOUS
  Administered 2014-05-17: 50 ug via INTRAVENOUS

## 2014-05-17 MED ORDER — DOCUSATE SODIUM 100 MG PO CAPS
100.0000 mg | ORAL_CAPSULE | Freq: Two times a day (BID) | ORAL | Status: DC
  Administered 2014-05-17 – 2014-05-18 (×2): 100 mg via ORAL

## 2014-05-17 MED ORDER — TRANEXAMIC ACID 100 MG/ML IV SOLN
1000.0000 mg | Freq: Once | INTRAVENOUS | Status: AC
  Administered 2014-05-17: 1000 mg via INTRAVENOUS
  Filled 2014-05-17: qty 10

## 2014-05-17 MED ORDER — CLONIDINE HCL 0.2 MG PO TABS
0.2000 mg | ORAL_TABLET | Freq: Two times a day (BID) | ORAL | Status: DC
  Administered 2014-05-17 – 2014-05-18 (×2): 0.2 mg via ORAL
  Filled 2014-05-17 (×3): qty 1

## 2014-05-17 MED ORDER — FERROUS SULFATE 325 (65 FE) MG PO TABS
325.0000 mg | ORAL_TABLET | Freq: Three times a day (TID) | ORAL | Status: DC
  Administered 2014-05-18: 325 mg via ORAL
  Filled 2014-05-17 (×4): qty 1

## 2014-05-17 MED ORDER — CHLORHEXIDINE GLUCONATE 4 % EX LIQD: 60.0000 mL | Freq: Once | CUTANEOUS | Status: DC

## 2014-05-17 MED ORDER — FENTANYL CITRATE 0.05 MG/ML IJ SOLN
INTRAMUSCULAR | Status: DC | PRN
  Administered 2014-05-17 (×2): 50 ug via INTRAVENOUS

## 2014-05-17 MED ORDER — METHOCARBAMOL 500 MG PO TABS
500.0000 mg | ORAL_TABLET | Freq: Four times a day (QID) | ORAL | Status: DC | PRN
  Administered 2014-05-17 – 2014-05-18 (×3): 500 mg via ORAL
  Filled 2014-05-17 (×3): qty 1

## 2014-05-17 MED ORDER — BUPIVACAINE-EPINEPHRINE (PF) 0.25% -1:200000 IJ SOLN
INTRAMUSCULAR | Status: DC | PRN
  Administered 2014-05-17: 30 mL

## 2014-05-17 MED ORDER — LACTATED RINGERS IV SOLN
INTRAVENOUS | Status: DC
  Administered 2014-05-17: 1000 mL via INTRAVENOUS
  Administered 2014-05-17 (×2): via INTRAVENOUS

## 2014-05-17 MED ORDER — KETOROLAC TROMETHAMINE 30 MG/ML IJ SOLN
INTRAMUSCULAR | Status: DC | PRN
  Administered 2014-05-17: 30 mg

## 2014-05-17 MED ORDER — PROPOFOL INFUSION 10 MG/ML OPTIME
INTRAVENOUS | Status: DC | PRN
Start: 2014-05-17 — End: 2014-05-17
  Administered 2014-05-17: 180 ug/kg/min via INTRAVENOUS

## 2014-05-17 MED ORDER — BUPIVACAINE HCL (PF) 0.75 % IJ SOLN
INTRAMUSCULAR | Status: DC | PRN
  Administered 2014-05-17: 1.8 mL

## 2014-05-17 MED ORDER — DIPHENHYDRAMINE HCL 25 MG PO CAPS: 25.0000 mg | ORAL_CAPSULE | Freq: Four times a day (QID) | ORAL | Status: DC | PRN

## 2014-05-17 MED ORDER — PHENOL 1.4 % MT LIQD: 1.0000 | OROMUCOSAL | Status: DC | PRN

## 2014-05-17 MED ORDER — ONDANSETRON HCL 4 MG/2ML IJ SOLN
INTRAMUSCULAR | Status: AC
  Filled 2014-05-17: qty 2

## 2014-05-17 MED ORDER — POLYETHYLENE GLYCOL 3350 17 G PO PACK
17.0000 g | PACK | Freq: Two times a day (BID) | ORAL | Status: DC
  Administered 2014-05-18: 17 g via ORAL

## 2014-05-17 MED ORDER — ONDANSETRON HCL 4 MG/2ML IJ SOLN: 4.0000 mg | Freq: Four times a day (QID) | INTRAMUSCULAR | Status: DC | PRN

## 2014-05-17 MED ORDER — ATENOLOL 50 MG PO TABS
50.0000 mg | ORAL_TABLET | Freq: Every morning | ORAL | Status: DC
  Administered 2014-05-18: 50 mg via ORAL
  Filled 2014-05-17: qty 1

## 2014-05-17 MED ORDER — STERILE WATER FOR IRRIGATION IR SOLN
Status: DC | PRN
  Administered 2014-05-17: 1500 mL

## 2014-05-17 MED ORDER — MIDAZOLAM HCL 5 MG/5ML IJ SOLN
INTRAMUSCULAR | Status: DC | PRN
  Administered 2014-05-17 (×2): 1 mg via INTRAVENOUS

## 2014-05-17 MED ORDER — HYDROCODONE-ACETAMINOPHEN 7.5-325 MG PO TABS
1.0000 | ORAL_TABLET | ORAL | Status: DC
  Administered 2014-05-17: 1 via ORAL
  Administered 2014-05-17 – 2014-05-18 (×5): 2 via ORAL
  Filled 2014-05-17 (×6): qty 2

## 2014-05-17 MED ORDER — MIDAZOLAM HCL 2 MG/2ML IJ SOLN
INTRAMUSCULAR | Status: AC
  Filled 2014-05-17: qty 2

## 2014-05-17 MED ORDER — DEXAMETHASONE SODIUM PHOSPHATE 10 MG/ML IJ SOLN
10.0000 mg | Freq: Once | INTRAMUSCULAR | Status: AC
Start: 2014-05-17 — End: 2014-05-17
  Administered 2014-05-17: 10 mg via INTRAVENOUS

## 2014-05-17 MED ORDER — BUPIVACAINE-EPINEPHRINE (PF) 0.25% -1:200000 IJ SOLN
INTRAMUSCULAR | Status: AC
  Filled 2014-05-17: qty 30

## 2014-05-17 MED ORDER — KETOROLAC TROMETHAMINE 30 MG/ML IJ SOLN
INTRAMUSCULAR | Status: AC
  Filled 2014-05-17: qty 1

## 2014-05-17 MED ORDER — METOCLOPRAMIDE HCL 10 MG PO TABS
5.0000 mg | ORAL_TABLET | Freq: Three times a day (TID) | ORAL | Status: DC | PRN
Start: 2014-05-17 — End: 2014-05-18

## 2014-05-17 MED ORDER — POTASSIUM CHLORIDE 2 MEQ/ML IV SOLN
INTRAVENOUS | Status: DC
  Administered 2014-05-17: 22:00:00 via INTRAVENOUS
  Filled 2014-05-17 (×5): qty 1000

## 2014-05-17 MED ORDER — MAGNESIUM CITRATE PO SOLN: 1.0000 | Freq: Once | ORAL | Status: AC | PRN

## 2014-05-17 MED ORDER — SODIUM CHLORIDE 0.9 % IJ SOLN
INTRAMUSCULAR | Status: AC
  Filled 2014-05-17: qty 50

## 2014-05-17 MED ORDER — ALUM & MAG HYDROXIDE-SIMETH 200-200-20 MG/5ML PO SUSP: 30.0000 mL | ORAL | Status: DC | PRN

## 2014-05-17 MED ORDER — DEXAMETHASONE SODIUM PHOSPHATE 10 MG/ML IJ SOLN
10.0000 mg | Freq: Once | INTRAMUSCULAR | Status: AC
  Administered 2014-05-18: 10 mg via INTRAVENOUS
  Filled 2014-05-17: qty 1

## 2014-05-17 MED ORDER — CEFAZOLIN SODIUM-DEXTROSE 2-3 GM-% IV SOLR
2.0000 g | Freq: Four times a day (QID) | INTRAVENOUS | Status: AC
  Administered 2014-05-17 – 2014-05-18 (×2): 2 g via INTRAVENOUS
  Filled 2014-05-17 (×2): qty 50

## 2014-05-17 MED ORDER — INFLUENZA VAC SPLIT QUAD 0.5 ML IM SUSY
0.5000 mL | PREFILLED_SYRINGE | INTRAMUSCULAR | Status: DC
  Filled 2014-05-17 (×2): qty 0.5

## 2014-05-17 MED ORDER — ONDANSETRON HCL 4 MG PO TABS: 4.0000 mg | ORAL_TABLET | Freq: Four times a day (QID) | ORAL | Status: DC | PRN

## 2014-05-17 MED ORDER — DEXTROSE 5 % IV SOLN
500.0000 mg | Freq: Four times a day (QID) | INTRAVENOUS | Status: DC | PRN
  Administered 2014-05-17: 500 mg via INTRAVENOUS
  Filled 2014-05-17 (×2): qty 5

## 2014-05-17 MED ORDER — HYDROMORPHONE HCL 1 MG/ML IJ SOLN
0.2500 mg | INTRAMUSCULAR | Status: DC | PRN
  Administered 2014-05-17: 0.5 mg via INTRAVENOUS

## 2014-05-17 SURGICAL SUPPLY — 58 items
ADH SKN CLS APL DERMABOND .7 (GAUZE/BANDAGES/DRESSINGS) ×1
BAG SPEC THK2 15X12 ZIP CLS (MISCELLANEOUS)
BAG ZIPLOCK 12X15 (MISCELLANEOUS) IMPLANT
BANDAGE ELASTIC 6 VELCRO ST LF (GAUZE/BANDAGES/DRESSINGS) ×2 IMPLANT
BANDAGE ESMARK 6X9 LF (GAUZE/BANDAGES/DRESSINGS) ×1 IMPLANT
BLADE SAW SGTL 13.0X1.19X90.0M (BLADE) ×2 IMPLANT
BNDG CMPR 9X6 STRL LF SNTH (GAUZE/BANDAGES/DRESSINGS) ×1
BNDG ESMARK 6X9 LF (GAUZE/BANDAGES/DRESSINGS) ×2
BOWL SMART MIX CTS (DISPOSABLE) ×2 IMPLANT
CAPT KNEE TOTAL 3 ATTUNE ×1 IMPLANT
CEMENT HV SMART SET (Cement) ×2 IMPLANT
CUFF TOURN SGL QUICK 34 (TOURNIQUET CUFF) ×2
CUFF TRNQT CYL 34X4X40X1 (TOURNIQUET CUFF) ×1 IMPLANT
DECANTER SPIKE VIAL GLASS SM (MISCELLANEOUS) ×2 IMPLANT
DERMABOND ADVANCED (GAUZE/BANDAGES/DRESSINGS) ×1
DERMABOND ADVANCED .7 DNX12 (GAUZE/BANDAGES/DRESSINGS) IMPLANT
DRAPE EXTREMITY T 121X128X90 (DRAPE) ×2 IMPLANT
DRAPE POUCH INSTRU U-SHP 10X18 (DRAPES) ×2 IMPLANT
DRAPE U-SHAPE 47X51 STRL (DRAPES) ×2 IMPLANT
DRSG AQUACEL AG ADV 3.5X10 (GAUZE/BANDAGES/DRESSINGS) ×2 IMPLANT
DURAPREP 26ML APPLICATOR (WOUND CARE) ×4 IMPLANT
ELECT REM PT RETURN 9FT ADLT (ELECTROSURGICAL) ×2
ELECTRODE REM PT RTRN 9FT ADLT (ELECTROSURGICAL) ×1 IMPLANT
FACESHIELD WRAPAROUND (MASK) ×10 IMPLANT
FACESHIELD WRAPAROUND OR TEAM (MASK) ×5 IMPLANT
GLOVE BIOGEL PI IND STRL 7.5 (GLOVE) ×1 IMPLANT
GLOVE BIOGEL PI IND STRL 8.5 (GLOVE) ×1 IMPLANT
GLOVE BIOGEL PI INDICATOR 7.5 (GLOVE) ×1
GLOVE BIOGEL PI INDICATOR 8.5 (GLOVE) ×1
GLOVE ECLIPSE 8.0 STRL XLNG CF (GLOVE) ×2 IMPLANT
GLOVE ORTHO TXT STRL SZ7.5 (GLOVE) ×4 IMPLANT
GOWN SPEC L3 XXLG W/TWL (GOWN DISPOSABLE) ×2 IMPLANT
GOWN STRL REUS W/TWL LRG LVL3 (GOWN DISPOSABLE) ×2 IMPLANT
HANDPIECE INTERPULSE COAX TIP (DISPOSABLE) ×2
IMMOBILIZER KNEE 20 (SOFTGOODS) ×1 IMPLANT
KIT BASIN OR (CUSTOM PROCEDURE TRAY) ×2 IMPLANT
LIQUID BAND (GAUZE/BANDAGES/DRESSINGS) ×2 IMPLANT
MANIFOLD NEPTUNE II (INSTRUMENTS) ×2 IMPLANT
NDL SAFETY ECLIPSE 18X1.5 (NEEDLE) ×1 IMPLANT
NEEDLE HYPO 18GX1.5 SHARP (NEEDLE) ×2
PACK TOTAL JOINT (CUSTOM PROCEDURE TRAY) ×2 IMPLANT
PEN SKIN MARKING BROAD (MISCELLANEOUS) ×2 IMPLANT
POSITIONER SURGICAL ARM (MISCELLANEOUS) ×2 IMPLANT
SET HNDPC FAN SPRY TIP SCT (DISPOSABLE) ×1 IMPLANT
SET PAD KNEE POSITIONER (MISCELLANEOUS) ×2 IMPLANT
SUCTION FRAZIER 12FR DISP (SUCTIONS) ×2 IMPLANT
SUT MNCRL AB 4-0 PS2 18 (SUTURE) ×2 IMPLANT
SUT VIC AB 1 CT1 36 (SUTURE) ×2 IMPLANT
SUT VIC AB 2-0 CT1 27 (SUTURE) ×6
SUT VIC AB 2-0 CT1 TAPERPNT 27 (SUTURE) ×3 IMPLANT
SUT VLOC 180 0 24IN GS25 (SUTURE) ×2 IMPLANT
SYR 50ML LL SCALE MARK (SYRINGE) ×2 IMPLANT
TOWEL OR 17X26 10 PK STRL BLUE (TOWEL DISPOSABLE) ×2 IMPLANT
TOWEL OR NON WOVEN STRL DISP B (DISPOSABLE) IMPLANT
TRAY FOLEY CATH 14FRSI W/METER (CATHETERS) ×2 IMPLANT
WATER STERILE IRR 1500ML POUR (IV SOLUTION) ×2 IMPLANT
WRAP KNEE MAXI GEL POST OP (GAUZE/BANDAGES/DRESSINGS) ×2 IMPLANT
YANKAUER SUCT BULB TIP 10FT TU (MISCELLANEOUS) ×2 IMPLANT

## 2014-05-17 NOTE — Transfer of Care (Signed)
Immediate Anesthesia Transfer of Care Note  Patient: Natasha Hunter  Procedure(s) Performed: Procedure(s) (LRB): LEFT TOTAL KNEE ARTHROPLASTY (Left)  Patient Location: PACU  Anesthesia Type: Spinal  Level of Consciousness: sedated, patient cooperative and responds to stimulation  Airway & Oxygen Therapy: Patient Spontanous Breathing and Patient connected to face mask oxgen  Post-op Assessment: Report given to PACU RN and Post -op Vital signs reviewed and stable  Post vital signs: Reviewed and stable  Complications: No apparent anesthesia complications

## 2014-05-17 NOTE — Op Note (Signed)
NAME:  Natasha Hunter                      MEDICAL RECORD NO.:  [id number]                             FACILITY:  [facility]      PHYSICIAN:  Madlyn Frankel. Charlann Boxer, M.D.  DATE OF BIRTH:  [birth year]      DATE OF PROCEDURE:  05/17/2014                                     OPERATIVE REPORT         PREOPERATIVE DIAGNOSIS:  Left knee osteoarthritis.      POSTOPERATIVE DIAGNOSIS:  Left knee osteoarthritis.      FINDINGS:  The patient was noted to have complete loss of cartilage and   bone-on-bone arthritis with associated osteophytes in the lateral and patellofemoral compartments of   the knee with a significant synovitis and associated effusion.      PROCEDURE:  Left total knee replacement.      COMPONENTS USED:  DePuy Attune rotating platform posterior stabilized knee   system, a size 4 N femur, 4 tibia, size 6 mm AOX PS insert, and 38 patellar   button.      SURGEON:  Madlyn Frankel. Charlann Boxer, M.D.      ASSISTANT:  Lanney Gins, PA-C.      ANESTHESIA:  Spinal.      SPECIMENS:  None.      COMPLICATION:  None.      DRAINS:  None.  EBL: <50cc      TOURNIQUET TIME:   at     The patient was stable to the recovery room.      INDICATION FOR PROCEDURE:  [name] is a 59 y.o. female patient of   mine.  The patient had been seen, evaluated, and treated conservatively in the   office with medication, activity modification, and injections.  The patient had   radiographic changes of bone-on-bone arthritis with endplate sclerosis and osteophytes noted.      The patient failed conservative measures including medication, injections, and activity modification, and at this point was ready for more definitive measures.   Based on the radiographic changes and failed conservative measures, the patient   decided to proceed with total knee replacement.  Risks of infection,   DVT, component failure, need for revision surgery, postop course, and   expectations were all   discussed and  reviewed.  Consent was obtained for benefit of pain   relief.      PROCEDURE IN DETAIL:  The patient was brought to the operative theater.   Once adequate anesthesia, preoperative antibiotics, 2 gm of Ancef, 1gm of Tranexamic Acid, and  of Decadron administered, the patient was positioned supine with the left thigh tourniquet placed.  The  left lower extremity was prepped and draped in sterile fashion.  A time-   out was performed identifying the patient, planned procedure, and   extremity.      The left lower extremity was placed in the [facility] leg holder.  The leg was   exsanguinated, tourniquet elevated to 250 mmHg.  A midline incision was   made followed by median parapatellar arthrotomy.  Following initial   exposure, attention was first directed to the  patella.  Precut   measurement was noted to be 22 mm.  I resected down to 14 mm and used a   38 patellar button to restore patellar height as well as cover the cut   surface.      The lug holes were drilled and a metal shim was placed to protect the   patella from retractors and saw blades.      At this point, attention was now directed to the femur.  The femoral   canal was opened with a drill, irrigated to try to prevent fat emboli.  An   intramedullary rod was passed at 5 degrees valgus, 9 mm of bone was   resected off the distal femur.  Following this resection, the tibia was   subluxated anteriorly.  Using the extramedullary guide, 2 mm of bone was resected off   the proximal lateral tibia.  We confirmed the gap would be   stable medially and laterally with a size 6 mm insert as well as confirmed   the cut was perpendicular in the coronal plane, checking with an alignment rod.      Once this was done, I sized the femur to be a size 4 in the anterior-   posterior dimension, chose a narrow component based on medial and   lateral dimension.  The size 4 rotation block was then pinned in   position anterior referenced using the  C-clamp to set rotation.  The   anterior, posterior, and  chamfer cuts were made without difficulty nor   notching making certain that I was along the anterior cortex to help   with flexion gap stability.      The final box cut was made off the lateral aspect of distal femur.      At this point, the tibia was sized to be a size 4, the size 4 tray was   then pinned in position through the medial third of the tubercle,   drilled, and keel punched.  Trial reduction was now carried with a 4 femur,  4 tibia, a size 6 mm insert, and the 38 patella botton.  The knee was brought to   extension, full extension with good flexion stability with the patella   tracking through the trochlea without application of pressure.  Given   all these findings, the trial components removed.  Final components were   opened and cement was mixed.  The knee was irrigated with normal saline   solution and pulse lavage.  The synovial lining was   then injected with 30cc of 0.25% Marcaine with epinephrine and 1 cc of Toradol plus 30cc of NS,   total of 61 cc.      The knee was irrigated.  Final implants were then cemented onto clean and   dried cut surfaces of bone with the knee brought to extension with a 6 mm trial insert.      Once the cement had fully cured, the excess cement was removed   throughout the knee.  I confirmed I was satisfied with the range of   motion and stability, and the final size 6 mm PS AOX insert was chosen.  It was   placed into the knee.      The tourniquet had been let down at 45 minutes.  No significant   hemostasis required.  The   extensor mechanism was then reapproximated using #1 Vicryl and #0 V-lock sutures with the knee   in flexion.  The   remaining wound was closed with 2-0 Vicryl and running 4-0 Monocryl.   The knee was cleaned, dried, dressed sterilely using Dermabond and   Aquacel dressing.  The patient was then   brought to recovery room in stable condition, tolerating the  procedure   well.   Please note that Physician Assistant, Lanney Gins, PA-C, was present for the entirety of the case, and was utilized for pre-operative positioning, peri-operative retractor management, general facilitation of the procedure.  He was also utilized for primary wound closure at the end of the case.              Madlyn Frankel Charlann Boxer, M.D.    05/17/2014 12:26 PM

## 2014-05-17 NOTE — Interval H&P Note (Signed)
History and Physical Interval Note:  05/17/2014 11:52 AM  Natasha Hunter  has presented today for surgery, with the diagnosis of left knee osteoarthritis  The various methods of treatment have been discussed with the patient and family. After consideration of risks, benefits and other options for treatment, the patient has consented to  Procedure(s): LEFT TOTAL KNEE ARTHROPLASTY (Left) as a surgical intervention .  The patient's history has been reviewed, patient examined, no change in status, stable for surgery.  I have reviewed the patient's chart and labs.  Questions were answered to the patient's satisfaction.     Shelda PalLIN,Marquett Bertoli D

## 2014-05-17 NOTE — Anesthesia Procedure Notes (Signed)
Spinal Patient location during procedure: OR Start time: 05/17/2014 12:45 PM Staffing Anesthesiologist: Karie SchwalbeJUDD, Valli Randol JENNETTE Performed by: anesthesiologist  Preanesthetic Checklist Completed: patient identified, site marked, surgical consent, pre-op evaluation, timeout performed, IV checked, risks and benefits discussed and monitors and equipment checked Spinal Block Patient position: sitting Prep: Betadine Patient monitoring: heart rate, continuous pulse ox and blood pressure Location: L3-4 Injection technique: single-shot Needle Needle type: Sprotte  Needle gauge: 24 G Needle length: 9 cm Additional Notes Functioning IV was confirmed and monitors were applied. Sterile prep and drape, including hand hygiene, mask and sterile gloves were used. The patient was positioned and the spine was prepped. The skin was anesthetized with lidocaine.  Free flow of clear CSF was obtained prior to injecting local anesthetic into the CSF.  The spinal needle aspirated freely following injection.  The needle was carefully withdrawn.  The patient tolerated the procedure well. Consent was obtained prior to procedure with all questions answered and concerns addressed. Risks including but not limited to bleeding, infection, nerve damage, paralysis, failed block, inadequate analgesia, allergic reaction, high spinal, itching and headache were discussed and the patient wished to proceed.   Karie SchwalbeMary Khris Jansson, MD

## 2014-05-17 NOTE — Anesthesia Postprocedure Evaluation (Signed)
  Anesthesia Post-op Note  Patient: Natasha HarrisonDeborah A Robotham  Procedure(s) Performed: Procedure(s) (LRB): LEFT TOTAL KNEE ARTHROPLASTY (Left)  Patient Location: PACU  Anesthesia Type: Spinal  Level of Consciousness: awake and alert   Airway and Oxygen Therapy: Patient Spontanous Breathing  Post-op Pain: mild  Post-op Assessment: Post-op Vital signs reviewed, Patient's Cardiovascular Status Stable, Respiratory Function Stable, Patent Airway and No signs of Nausea or vomiting  Last Vitals:  Filed Vitals:   05/17/14 1545  BP:   Pulse:   Temp: 36.6 C  Resp:     Post-op Vital Signs: stable   Complications: No apparent anesthesia complications

## 2014-05-18 ENCOUNTER — Encounter (HOSPITAL_COMMUNITY): Payer: Self-pay | Admitting: Orthopedic Surgery

## 2014-05-18 LAB — CBC
HEMATOCRIT: 35.6 % — AB (ref 36.0–46.0)
HEMOGLOBIN: 11.8 g/dL — AB (ref 12.0–15.0)
MCH: 29.9 pg (ref 26.0–34.0)
MCHC: 33.1 g/dL (ref 30.0–36.0)
MCV: 90.4 fL (ref 78.0–100.0)
Platelets: 225 10*3/uL (ref 150–400)
RBC: 3.94 MIL/uL (ref 3.87–5.11)
RDW: 12.4 % (ref 11.5–15.5)
WBC: 12.5 10*3/uL — ABNORMAL HIGH (ref 4.0–10.5)

## 2014-05-18 LAB — BASIC METABOLIC PANEL
Anion gap: 5 (ref 5–15)
BUN: 15 mg/dL (ref 6–23)
CALCIUM: 8.8 mg/dL (ref 8.4–10.5)
CHLORIDE: 103 mmol/L (ref 96–112)
CO2: 27 mmol/L (ref 19–32)
Creatinine, Ser: 0.95 mg/dL (ref 0.50–1.10)
GFR calc Af Amer: 75 mL/min — ABNORMAL LOW (ref >90)
GFR calc non Af Amer: 65 mL/min — ABNORMAL LOW (ref >90)
GLUCOSE: 148 mg/dL — AB (ref 70–99)
Potassium: 3.8 mmol/L (ref 3.5–5.1)
SODIUM: 135 mmol/L (ref 135–145)

## 2014-05-18 MED ORDER — FERROUS SULFATE 325 (65 FE) MG PO TABS: 325.0000 mg | ORAL_TABLET | Freq: Three times a day (TID) | ORAL | Status: DC

## 2014-05-18 MED ORDER — POLYETHYLENE GLYCOL 3350 17 G PO PACK: 17.0000 g | PACK | Freq: Two times a day (BID) | ORAL | Status: DC

## 2014-05-18 MED ORDER — HYDROCODONE-ACETAMINOPHEN 7.5-325 MG PO TABS: 1.0000 | ORAL_TABLET | ORAL | Status: DC | PRN

## 2014-05-18 MED ORDER — DOCUSATE SODIUM 100 MG PO CAPS: 100.0000 mg | ORAL_CAPSULE | Freq: Two times a day (BID) | ORAL | Status: DC

## 2014-05-18 MED ORDER — ASPIRIN 325 MG PO TBEC: 325.0000 mg | DELAYED_RELEASE_TABLET | Freq: Two times a day (BID) | ORAL | Status: AC

## 2014-05-18 MED ORDER — METHOCARBAMOL 500 MG PO TABS: 500.0000 mg | ORAL_TABLET | Freq: Four times a day (QID) | ORAL | Status: DC | PRN

## 2014-05-18 NOTE — Progress Notes (Signed)
Patient ID: Natasha Hunter, female   DOB: 1955/07/10, 59 y.o.   MRN: [id number] Subjective: 1 Day Post-Op Procedure(s) (LRB): LEFT TOTAL KNEE ARTHROPLASTY (Left)    Patient reports pain as moderate.  Doing well, no events.  Ready to get going  Objective:   VITALS:   Filed Vitals:   05/18/14 0530  BP: 111/74  Pulse: 73  Temp: 97.9 F (36.6 C)  Resp: 16    Neurovascular intact Incision: dressing C/D/I  LABS  Recent Labs  05/18/14 0500  HGB 11.8*  HCT 35.6*  WBC 12.5*  PLT 225     Recent Labs  05/18/14 0500  NA 135  K 3.8  BUN 15  CREATININE 0.95  GLUCOSE 148*    No results for input(s): LABPT, INR in the last 72 hours.   Assessment/Plan: 1 Day Post-Op Procedure(s) (LRB): LEFT TOTAL KNEE ARTHROPLASTY (Left)   Advance diet Up with therapy Discharge home with home health most likely after PT this pm Reviewed goals  RTC in 2 weeks, appt already set

## 2014-05-18 NOTE — Evaluation (Signed)
Occupational Therapy Evaluation Patient Details Name: Natasha Hunter MRN: [id number] DOB: 09/22/1955 Today's Date: 05/18/2014    History of Present Illness L TKA, h/o R TKA several years ago   Clinical Impression   This 59 year old female was admitted for the above surgery.  All education was completed. Pt was mod I with ADLs prior to admission. Pt will have 24/7 assistance at home and only needs min A for ADLs at this time. No further OT is needed at this time    Follow Up Recommendations  No OT follow up;Supervision/Assistance - 24 hour    Equipment Recommendations  None recommended by OT    Recommendations for Other Services       Precautions / Restrictions Precautions Precautions: Knee Restrictions Weight Bearing Restrictions: No Other Position/Activity Restrictions: WBAT      Mobility Bed Mobility Overal bed mobility: Needs Assistance Bed Mobility: Supine to Sit     Supine to sit: Min assist     General bed mobility comments: pt was up in chair  Transfers Overall transfer level: Needs assistance Equipment used: Rolling walker (2 wheeled) Transfers: Sit to/from Stand Sit to Stand: Min guard         General transfer comment: close guard for safety and cues for UE placement    Balance Overall balance assessment: Modified Independent                                          ADL Overall ADL's : Needs assistance/impaired     Grooming: Oral care;Standing;Supervision/safety       Lower Body Bathing: Minimal assistance;Sit to/from stand       Lower Body Dressing: Minimal assistance;Sit to/from stand   Toilet Transfer: Min guard;BSC;Ambulation   Toileting- Architect and Hygiene: Modified independent;Sitting/lateral lean         General ADL Comments: Pt ambulated to bathroom:  Speed is slow due but safe.  Pt with thigh pain--had limped/compensated before.  She is very motivated to doing as much as she can for  herself.  She crossed RLE under L ankle to support leg when getting back in chair.  Used sheet to lift leg for pillow under calf.  Mother will stay with her for 2 weeks.  Reviewed tub readiness/safety.  Pt will sponge bathe until she can step over ledge.     Vision     Perception     Praxis      Pertinent Vitals/Pain Pain Assessment: 0-10 Pain Score: 10-Worst pain ever (with movement) Pain Location: L thigh and knee Pain Descriptors / Indicators: Tightness Pain Intervention(s): Monitored during session     Hand Dominance     Extremity/Trunk Assessment Upper Extremity Assessment Upper Extremity Assessment: Overall WFL for tasks assessed      Cervical / Trunk Assessment Cervical / Trunk Assessment: Normal   Communication Communication Communication: No difficulties   Cognition Arousal/Alertness: Awake/alert Behavior During Therapy: WFL for tasks assessed/performed Overall Cognitive Status: Within Functional Limits for tasks assessed                     General Comments       Exercises       Shoulder Instructions      Home Living Family/patient expects to be discharged to:: Private residence Living Arrangements: Parent Available Help at Discharge: Family;Available 24 hours/day Type of Home: Apartment Home Access:  Stairs to enter Entergy CorporationEntrance Stairs-Number of Steps: flight Entrance Stairs-Rails: Right Home Layout: One level     Bathroom Shower/Tub: Tub/shower unit Shower/tub characteristics: Curtain FirefighterBathroom Toilet: Standard     Home Equipment: Environmental consultantWalker - 2 wheels;Shower seat;Toilet riser;Cane - single point   Additional Comments: pt normally lives alone, [place]      Prior Functioning/Environment Level of Independence: Independent with assistive device(s)        Comments: used SPC    OT Diagnosis: Generalized weakness   OT Problem List:     OT Treatment/Interventions:      OT Goals(Current goals can  be found in the care plan section) Acute Rehab OT Goals Patient Stated Goal: to dance at home, to be able to grocery shop, return to work at [employer]  OT Frequency:     Barriers to D/C:            Co-evaluation              End of Session    Activity Tolerance: Patient tolerated treatment well Patient left: in chair;with call bell/phone within reach;with family/visitor present   Time: 5784-69621051-1122 OT Time Calculation (min): 31 min Charges:  OT General Charges $OT Visit: 1 Procedure OT Evaluation $Initial OT Evaluation Tier I: 1 Procedure OT Treatments $Self Care/Home Management : 8-22 mins G-Codes:    Rishita Petron 05/18/2014, 12:18 PM  Marica OtterMaryellen Demaryius Imran, OTR/L [phone] 05/18/2014

## 2014-05-18 NOTE — Discharge Summary (Signed)
Physician Discharge Summary  Patient ID: MYLDRED RAJU MRN: [id number] DOB/AGE: December 01, 1955 59 y.o.  Admit date: 05/17/2014 Discharge date:  05/18/2014  Procedures:  Procedure(s) (LRB): LEFT TOTAL KNEE ARTHROPLASTY (Left)  Attending Physician:  Dr. Durene Romans   Admission Diagnoses:   Left knee primary OA /pain  Discharge Diagnoses:  Principal Problem:   S/P left TKA Active Problems:   S/P knee replacement  Past Medical History  Diagnosis Date  . Complication of anesthesia   . PONV (postoperative nausea and vomiting)   . Hypertension   . Asthma     INFFREQUENT PROBLEMS - DOES NOT HAVE INHALER  . Arthritis   . Genital herpes   . History of transfusion   . Difficulty sleeping   . Depression     HPI:    Natasha Hunter, 59 y.o. female, has a history of pain and functional disability in the left knee due to arthritis and has failed non-surgical conservative treatments for greater than 12 weeks. She has had a history of right total knee arthroplasty three or four years ago that she has done well with. She recognizes at this point that her left knee is persistently bothersome and at this point ready to proceed with total knee arthroplasty. She has no desire to continue conservative measures based on the persistence of her symptoms. On examination, she is seen and evaluated in the office. Today, she is a very pleasant female in no acute distress. She walks with a slight limp favoring this left knee with a valgus tendency of the left knee with no significant fixed flexion or valgus deformity. Her right knee incision is healed with full knee extension and flexion. She is tolerating this with stable ligaments with no signs of infection. Radiographs in the office today, an AP standing of both knees and a standing lateral of the left knee reveal advanced degenerative joint changes in the left knee with complete loss of joint space and patellofemoral lateral compartments with  tricompartment osteophytes. Based on her persistent discomfort and lack of response to previous treatment, she desires at this point to proceed with total knee arthroplasty. I reviewed the risks and benefits of the procedure, the postoperative course, and expectations of this as she has been through this in the past. Surgery will be scheduled through [facility] based on her request. We will see her in the postoperative period. We stressed the postoperative recovery and she is aware of this based on her previous surgery.  PCP: Hollice Espy, MD   Discharged Condition: good  Hospital Course:  Patient underwent the above stated procedure on 05/17/2014. Patient tolerated the procedure well and brought to the recovery room in good condition and subsequently to the floor.  POD #1 BP: 11/74 ; Pulse: 73 ; Temp: 97.9 F (36.6 C) ; Resp: 16 Patient reports pain as moderate. Doing well, no events. Ready to get going. Dorsiflexion/plantar flexion intact, incision: dressing C/D/I, no cellulitis present and compartment soft.   LABS  Basename    HGB  11.8  HCT  35.6    Discharge Exam: General appearance: alert, cooperative and no distress Extremities: Homans sign is negative, no sign of DVT, no edema, redness or tenderness in the calves or thighs and no ulcers, gangrene or trophic changes  Disposition:  Home with follow up in 2 weeks   Follow-up Information    Follow up with Shelda Pal, MD. Schedule an appointment as soon as possible for a visit in 2 weeks.  Specialty:  Orthopedic Surgery   Contact information:   [address] [place] [id number] [phone]       [facility]   Contact information:   [address] [place] [id number] [phone]       Discharge Instructions    Call MD / Call 911    Complete by:  As directed   If you experience chest pain or shortness of breath, CALL 911 and be transported to the  hospital emergency room.  If you develope a fever above 101 F, pus (white drainage) or increased drainage or redness at the wound, or calf pain, call your surgeon's office.     Change dressing    Complete by:  As directed   Maintain surgical dressing until follow up in the clinic. If the edges start to pull up, may reinforce with tape. If the dressing is no longer working, may remove and cover with gauze and tape, but must keep the area dry and clean.  Call with any questions or concerns.     Constipation Prevention    Complete by:  As directed   Drink plenty of fluids.  Prune juice may be helpful.  You may use a stool softener, such as Colace (over the counter) 100 mg twice a day.  Use MiraLax (over the counter) for constipation as needed.     Diet - low sodium heart healthy    Complete by:  As directed      Discharge instructions    Complete by:  As directed   Maintain surgical dressing until follow up in the clinic. If the edges start to pull up, may reinforce with tape. If the dressing is no longer working, may remove and cover with gauze and tape, but must keep the area dry and clean.  [facility]. Call with any questions or concerns.     Increase activity slowly as tolerated    Complete by:  As directed      TED hose    Complete by:  As directed   Use stockings (TED hose) for 2 weeks on both leg(s).  You may remove them at night for sleeping.     Weight bearing as tolerated    Complete by:  As directed   Laterality:  left  Extremity:  Lower             Medication List    STOP taking these medications        acetaminophen 650 MG CR tablet  Commonly known as:  TYLENOL     naproxen 500 MG tablet  Commonly known as:  NAPROSYN      TAKE these medications        aspirin 325 MG EC tablet  Take 1 tablet (325 mg total) by mouth 2 (two) times daily.     atenolol 50 MG tablet  Commonly known as:  TENORMIN  Take 50 mg by mouth every morning.       cloNIDine 0.2 MG tablet  Commonly known as:  CATAPRES  Take 0.2 mg by mouth 2 (two) times daily.     docusate sodium 100 MG capsule  Commonly known as:  COLACE  Take 1 capsule (100 mg total) by mouth 2 (two) times daily.     Estradiol 10 MCG Tabs vaginal tablet  Place 1 tablet vaginally 2 (two) times a week.     ferrous sulfate 325 (65  FE) MG tablet  Take 1 tablet (325 mg total) by mouth 3 (three) times daily after meals.     HYDROcodone-acetaminophen 7.5-325 MG per tablet  Commonly known as:  NORCO  Take 1-2 tablets by mouth every 4 (four) hours as needed for moderate pain.  Notes to Patient:  3:45 pm     lisinopril-hydrochlorothiazide 20-25 MG per tablet  Commonly known as:  PRINZIDE,ZESTORETIC  Take 1 tablet by mouth every morning.     methocarbamol 500 MG tablet  Commonly known as:  ROBAXIN  Take 1 tablet (500 mg total) by mouth every 6 (six) hours as needed for muscle spasms.     multivitamin with minerals Tabs tablet  Take 1 tablet by mouth daily.     polyethylene glycol packet  Commonly known as:  MIRALAX / GLYCOLAX  Take 17 g by mouth 2 (two) times daily.     traZODone 50 MG tablet  Commonly known as:  DESYREL  Take 50-150 mg by mouth at bedtime as needed for sleep.         Signed: Anastasio Auerbach. Kim Oki   PA-C  05/18/2014, 2:49 PM

## 2014-05-18 NOTE — Evaluation (Signed)
Physical Therapy Evaluation Patient Details Name: Natasha Hunter MRN: [id number] DOB: December 31, 1955 Today's Date: 05/18/2014   History of Present Illness  L TKA, h/o R TKA several years ago  Clinical Impression  *Pt is s/p TKA resulting in the deficits listed below (see PT Problem List).  Pt will benefit from skilled PT to increase their independence and safety with mobility to allow discharge to the venue listed below.   Pt ambulated 14'x2 with RW. 8/10 pain with walking limits gait tolerance. Instructed pt/mother in TKA exercises. Good progress expected. She has a flight of stairs to enter her apartment, will attempt stair training this afternoon.  **    Follow Up Recommendations Home health PT    Equipment Recommendations  None recommended by PT    Recommendations for Other Services       Precautions / Restrictions Precautions Precautions: Knee Restrictions Weight Bearing Restrictions: No Other Position/Activity Restrictions: WBAT      Mobility  Bed Mobility Overal bed mobility: Needs Assistance Bed Mobility: Supine to Sit     Supine to sit: Min assist     General bed mobility comments: min A for LLE, instructed pt to self assist LLE with RLE  Transfers Overall transfer level: Needs assistance Equipment used: Rolling walker (2 wheeled) Transfers: Sit to/from Stand Sit to Stand: Min assist         General transfer comment: cues for hand placement  Ambulation/Gait Ambulation/Gait assistance: Min guard Ambulation Distance (Feet): 28 Feet (14' x 2) Assistive device: Rolling walker (2 wheeled) Gait Pattern/deviations: Step-to pattern;Trunk flexed;Decreased step length - left;Antalgic   Gait velocity interpretation: Below normal speed for age/gender General Gait Details: cues for sequencing, heavy use of BUEs to unweight LLE 2* pain, cues to lift head, slow but steady, 8/10 pain with walking, pt tearful but determined to continue  Stairs             Wheelchair Mobility    Modified Rankin (Stroke Patients Only)       Balance Overall balance assessment: Modified Independent                                           Pertinent Vitals/Pain Pain Assessment: 0-10 Pain Score: 8  Pain Location: L knee with walking Pain Descriptors / Indicators: Tightness;Sore Pain Intervention(s): Monitored during session;Premedicated before session;Limited activity within patient's tolerance;Ice applied    Home Living Family/patient expects to be discharged to:: Private residence Living Arrangements: Parent Available Help at Discharge: Family;Available 24 hours/day Type of Home: Apartment Home Access: Stairs to enter Entrance Stairs-Rails: Right Entrance Stairs-Number of Steps: flight Home Layout: One level Home Equipment: Walker - 2 wheels;Shower seat;Toilet riser;Cane - single point Additional Comments: pt normally lives alone, her mother is here from [place] for 2 weeks    Prior Function Level of Independence: Independent with assistive device(s)         Comments: used SPC     Hand Dominance        Extremity/Trunk Assessment   Upper Extremity Assessment: Overall WFL for tasks assessed           Lower Extremity Assessment: LLE deficits/detail   LLE Deficits / Details: weak quad set, quad atrophy noted (pt stated LLE was shorter than R prior to surgery), ankle WNL, SLR 2/5, knee flexion 0-40* AAROM  Cervical / Trunk Assessment: Normal  Communication   Communication: No  difficulties  Cognition Arousal/Alertness: Awake/alert Behavior During Therapy: WFL for tasks assessed/performed Overall Cognitive Status: Within Functional Limits for tasks assessed                      General Comments      Exercises Total Joint Exercises Ankle Circles/Pumps: AROM;Both;10 reps;Supine Quad Sets: AROM;Both;5 reps;Supine Heel Slides: AAROM;Left;10 reps;Supine Goniometric ROM: 0-40* AAROM L knee       Assessment/Plan    PT Assessment Patient needs continued PT services  PT Diagnosis Acute pain;Difficulty walking   PT Problem List Decreased strength;Decreased range of motion;Decreased activity tolerance;Decreased knowledge of use of DME;Decreased mobility;Pain  PT Treatment Interventions DME instruction;Gait training;Stair training;Functional mobility training;Therapeutic activities;Therapeutic exercise   PT Goals (Current goals can be found in the Care Plan section) Acute Rehab PT Goals Patient Stated Goal: to dance at home, to be able to grocery shop, return to work at [employer] PT Goal Formulation: With patient/family Time For Goal Achievement: 06/01/14 Potential to Achieve Goals: Good    Frequency 7X/week   Barriers to discharge        Co-evaluation               End of Session Equipment Utilized During Treatment: Gait belt Activity Tolerance: Patient tolerated treatment well Patient left: in chair;with call bell/phone within reach;with family/visitor present Nurse Communication: Mobility status         Time: 0913-1008 PT Time Calculation (min) (ACUTE ONLY): 55 min   Charges:   PT Evaluation $Initial PT Evaluation Tier I: 1 Procedure PT Treatments $Gait Training: 8-22 mins $Therapeutic Exercise: 8-22 mins   PT G Codes:        Tamala SerUhlenberg, Ericberto Padget Kistler 05/18/2014, 10:19 AM [phone]

## 2014-05-18 NOTE — Progress Notes (Signed)
Physical Therapy Treatment Patient Details Name: Natasha Hunter MRN: [id number] DOB: 01-26-1956 Today's Date: 05/18/2014    History of Present Illness L TKA, h/o R TKA several years ago    PT Comments    Excellent progress this afternoon. Pt walked 130' with RW, completed stair training, and performed L TKA exercises with assist. Significantly less pain with walking this session. From PT standpoint she is ready to DC home. *  Follow Up Recommendations  Home health PT     Equipment Recommendations  None recommended by PT    Recommendations for Other Services       Precautions / Restrictions Precautions Precautions: Knee Restrictions Other Position/Activity Restrictions: WBAT    Mobility  Bed Mobility   Bed Mobility: Sit to Supine       Sit to supine: Modified independent (Device/Increase time)   General bed mobility comments: pt self assisted LLE with RLE  Transfers Overall transfer level: Needs assistance Equipment used: Rolling walker (2 wheeled) Transfers: Sit to/from Stand Sit to Stand: Supervision         General transfer comment: cues for positioning of LLE  Ambulation/Gait Ambulation/Gait assistance: Modified independent (Device/Increase time) Ambulation Distance (Feet): 130 Feet Assistive device: Rolling walker (2 wheeled) Gait Pattern/deviations: Step-to pattern;Step-through pattern     General Gait Details: cues to lift head, less pain with walking this session   Stairs Stairs: Yes   Stair Management: One rail Right;Step to pattern;Forwards;With cane Number of Stairs: 13 General stair comments: good sequencing, steady, no LOB  Wheelchair Mobility    Modified Rankin (Stroke Patients Only)       Balance                                    Cognition Arousal/Alertness: Awake/alert Behavior During Therapy: WFL for tasks assessed/performed Overall Cognitive Status: Within Functional Limits for tasks assessed                       Exercises Total Joint Exercises Ankle Circles/Pumps: AROM;Both;10 reps;Supine Quad Sets: AROM;Both;5 reps;Supine Short Arc Quad: AAROM;Left;5 reps Heel Slides: AAROM;Left;10 reps;Supine Hip ABduction/ADduction: AAROM;Left;5 reps;Supine Straight Leg Raises: AAROM;Left;5 reps;Supine Knee Flexion: AAROM;Left;5 reps;Seated Goniometric ROM: 0-55* L knee AAROM    General Comments        Pertinent Vitals/Pain Pain Score: 3  Pain Location: L knee and thigh Pain Descriptors / Indicators: Sore;Tightness Pain Intervention(s): Limited activity within patient's tolerance;Monitored during session;Premedicated before session;Ice applied    Home Living Family/patient expects to be discharged to:: Private residence Living Arrangements: Parent Available Help at Discharge: Family;Available 24 hours/day         Home Equipment: Walker - 2 wheels;Shower seat;Toilet riser;Cane - single point      Prior Function Level of Independence: Independent with assistive device(s)          PT Goals (current goals can now be found in the care plan section) Acute Rehab PT Goals Patient Stated Goal: to dance at home, to be able to grocery shop, return to work at [employer] PT Goal Formulation: With patient/family Time For Goal Achievement: 06/01/14 Potential to Achieve Goals: Good    Frequency  7X/week    PT Plan Current plan remains appropriate    Co-evaluation             End of Session Equipment Utilized During Treatment: Gait belt Activity Tolerance: Patient tolerated treatment well Patient  left: with call bell/phone within reach;with family/visitor present;in bed     Time: [id number] PT Time Calculation (min) (ACUTE ONLY): 53 min  Charges:  $Gait Training: 8-22 mins $Therapeutic Exercise: 8-22 mins $Self Care/Home Management: 8-22                    G Codes:      Natasha Hunter, Natasha Hunter 05/18/2014, 2:22 PM [phone]

## 2014-05-18 NOTE — Progress Notes (Signed)
Pt to d/c home with [facility] home [facility]. No DME needs. AVS reviewed and "My Chart" discussed with pt. Pt capable of verbalizing medications, signs and symptoms of infection, and follow-up appointments. Remains hemodynamically stable. No signs and symptoms of distress. Educated pt to return to ER in the case of SOB, dizziness, or chest pain.  

## 2015-01-18 ENCOUNTER — Other Ambulatory Visit: Payer: Self-pay

## 2015-01-18 DIAGNOSIS — Z1231 Encounter for screening mammogram for malignant neoplasm of breast: Secondary | ICD-10-CM

## 2015-02-04 ENCOUNTER — Ambulatory Visit
Admission: RE | Admit: 2015-02-04 | Discharge: 2015-02-04 | Disposition: A | Discharge status: Home / Self Care | Payer: BLUE CROSS/BLUE SHIELD | Source: Ambulatory Visit

## 2015-02-04 DIAGNOSIS — Z1231 Encounter for screening mammogram for malignant neoplasm of breast: Secondary | ICD-10-CM

## 2015-12-23 ENCOUNTER — Other Ambulatory Visit: Payer: Self-pay | Admitting: Family Medicine

## 2015-12-23 DIAGNOSIS — Z1231 Encounter for screening mammogram for malignant neoplasm of breast: Secondary | ICD-10-CM

## 2016-02-06 ENCOUNTER — Ambulatory Visit
Admission: RE | Admit: 2016-02-06 | Discharge: 2016-02-06 | Disposition: A | Discharge status: Home / Self Care | Payer: BLUE CROSS/BLUE SHIELD | Source: Ambulatory Visit | Attending: Family Medicine | Admitting: Family Medicine

## 2016-02-06 DIAGNOSIS — Z1231 Encounter for screening mammogram for malignant neoplasm of breast: Secondary | ICD-10-CM

## 2017-03-25 ENCOUNTER — Other Ambulatory Visit: Payer: Self-pay | Admitting: Family Medicine

## 2017-03-25 DIAGNOSIS — Z139 Encounter for screening, unspecified: Secondary | ICD-10-CM

## 2017-04-19 ENCOUNTER — Ambulatory Visit
Admission: RE | Admit: 2017-04-19 | Discharge: 2017-04-19 | Disposition: A | Discharge status: Home / Self Care | Payer: BLUE CROSS/BLUE SHIELD | Source: Ambulatory Visit | Attending: Family Medicine | Admitting: Family Medicine

## 2017-04-19 DIAGNOSIS — Z139 Encounter for screening, unspecified: Secondary | ICD-10-CM

## 2017-12-09 ENCOUNTER — Emergency Department (HOSPITAL_COMMUNITY)
Admission: EM | Admit: 2017-12-09 | Discharge: 2017-12-09 | Disposition: A | Discharge status: Home / Self Care | Payer: BLUE CROSS/BLUE SHIELD | Attending: Emergency Medicine | Admitting: Emergency Medicine

## 2017-12-09 ENCOUNTER — Other Ambulatory Visit: Payer: Self-pay

## 2017-12-09 ENCOUNTER — Encounter (HOSPITAL_COMMUNITY): Payer: Self-pay

## 2017-12-09 ENCOUNTER — Emergency Department (HOSPITAL_COMMUNITY): Payer: BLUE CROSS/BLUE SHIELD

## 2017-12-09 DIAGNOSIS — N390 Urinary tract infection, site not specified: Secondary | ICD-10-CM | POA: Diagnosis not present

## 2017-12-09 DIAGNOSIS — Z96653 Presence of artificial knee joint, bilateral: Secondary | ICD-10-CM | POA: Insufficient documentation

## 2017-12-09 DIAGNOSIS — J45909 Unspecified asthma, uncomplicated: Secondary | ICD-10-CM | POA: Diagnosis not present

## 2017-12-09 DIAGNOSIS — Z96641 Presence of right artificial hip joint: Secondary | ICD-10-CM | POA: Diagnosis not present

## 2017-12-09 DIAGNOSIS — I10 Essential (primary) hypertension: Secondary | ICD-10-CM | POA: Insufficient documentation

## 2017-12-09 DIAGNOSIS — R319 Hematuria, unspecified: Secondary | ICD-10-CM | POA: Insufficient documentation

## 2017-12-09 DIAGNOSIS — R030 Elevated blood-pressure reading, without diagnosis of hypertension: Secondary | ICD-10-CM

## 2017-12-09 DIAGNOSIS — R112 Nausea with vomiting, unspecified: Secondary | ICD-10-CM

## 2017-12-09 DIAGNOSIS — E860 Dehydration: Secondary | ICD-10-CM

## 2017-12-09 DIAGNOSIS — Z79899 Other long term (current) drug therapy: Secondary | ICD-10-CM | POA: Insufficient documentation

## 2017-12-09 DIAGNOSIS — R197 Diarrhea, unspecified: Secondary | ICD-10-CM

## 2017-12-09 LAB — COMPREHENSIVE METABOLIC PANEL
ALT: 29 U/L (ref 0–44)
ANION GAP: 14 (ref 5–15)
AST: 28 U/L (ref 15–41)
Albumin: 4.5 g/dL (ref 3.5–5.0)
Alkaline Phosphatase: 91 U/L (ref 38–126)
BUN: 17 mg/dL (ref 8–23)
CO2: 24 mmol/L (ref 22–32)
Calcium: 9.9 mg/dL (ref 8.9–10.3)
Chloride: 100 mmol/L (ref 98–111)
Creatinine, Ser: 1.45 mg/dL — ABNORMAL HIGH (ref 0.44–1.00)
GFR, EST AFRICAN AMERICAN: 44 mL/min — AB (ref >60)
GFR, EST NON AFRICAN AMERICAN: 38 mL/min — AB (ref >60)
Glucose, Bld: 147 mg/dL — ABNORMAL HIGH (ref 70–99)
Potassium: 3.1 mmol/L — ABNORMAL LOW (ref 3.5–5.1)
Sodium: 138 mmol/L (ref 135–145)
TOTAL PROTEIN: 9.3 g/dL — AB (ref 6.5–8.1)
Total Bilirubin: 0.5 mg/dL (ref 0.3–1.2)

## 2017-12-09 LAB — URINALYSIS, ROUTINE W REFLEX MICROSCOPIC
Bilirubin Urine: NEGATIVE
Glucose, UA: NEGATIVE mg/dL
Ketones, ur: 20 mg/dL — AB
NITRITE: POSITIVE — AB
Protein, ur: 100 mg/dL — AB
Specific Gravity, Urine: 1.018 (ref 1.005–1.030)
pH: 5 (ref 5.0–8.0)

## 2017-12-09 LAB — CBC
HCT: 49.6 % — ABNORMAL HIGH (ref 36.0–46.0)
HEMOGLOBIN: 17.8 g/dL — AB (ref 12.0–15.0)
MCH: 32.4 pg (ref 26.0–34.0)
MCHC: 35.9 g/dL (ref 30.0–36.0)
MCV: 90.2 fL (ref 78.0–100.0)
Platelets: 273 10*3/uL (ref 150–400)
RBC: 5.5 MIL/uL — ABNORMAL HIGH (ref 3.87–5.11)
RDW: 12.6 % (ref 11.5–15.5)
WBC: 8.7 10*3/uL (ref 4.0–10.5)

## 2017-12-09 LAB — LIPASE, BLOOD: Lipase: 30 U/L (ref 11–51)

## 2017-12-09 MED ORDER — ONDANSETRON HCL 4 MG/2ML IJ SOLN
4.0000 mg | Freq: Once | INTRAMUSCULAR | Status: AC
  Administered 2017-12-09: 4 mg via INTRAVENOUS
  Filled 2017-12-09: qty 2

## 2017-12-09 MED ORDER — PREDNISONE 20 MG PO TABS: 60.0000 mg | ORAL_TABLET | Freq: Every day | ORAL | 0 refills | Status: DC

## 2017-12-09 MED ORDER — SODIUM CHLORIDE 0.9 % IV BOLUS
1000.0000 mL | Freq: Once | INTRAVENOUS | Status: AC
  Administered 2017-12-09: 1000 mL via INTRAVENOUS

## 2017-12-09 MED ORDER — SODIUM CHLORIDE 0.9 % IV BOLUS: 500.0000 mL | Freq: Once | INTRAVENOUS | Status: DC

## 2017-12-09 MED ORDER — ONDANSETRON 4 MG PO TBDP: 4.0000 mg | ORAL_TABLET | Freq: Three times a day (TID) | ORAL | 0 refills | Status: DC | PRN

## 2017-12-09 MED ORDER — IPRATROPIUM-ALBUTEROL 0.5-2.5 (3) MG/3ML IN SOLN
3.0000 mL | Freq: Once | RESPIRATORY_TRACT | Status: AC
  Administered 2017-12-09: 3 mL via RESPIRATORY_TRACT
  Filled 2017-12-09: qty 3

## 2017-12-09 MED ORDER — POTASSIUM CHLORIDE CRYS ER 20 MEQ PO TBCR
20.0000 meq | EXTENDED_RELEASE_TABLET | Freq: Once | ORAL | Status: AC
  Administered 2017-12-09: 20 meq via ORAL
  Filled 2017-12-09: qty 1

## 2017-12-09 MED ORDER — CEPHALEXIN 500 MG PO CAPS: 500.0000 mg | ORAL_CAPSULE | Freq: Two times a day (BID) | ORAL | 0 refills | Status: DC

## 2017-12-09 MED ORDER — METOPROLOL TARTRATE 5 MG/5ML IV SOLN
5.0000 mg | Freq: Once | INTRAVENOUS | Status: AC
  Administered 2017-12-09: 5 mg via INTRAVENOUS
  Filled 2017-12-09: qty 5

## 2017-12-09 MED ORDER — CLONIDINE HCL 0.1 MG PO TABS
0.2000 mg | ORAL_TABLET | Freq: Once | ORAL | Status: AC
  Administered 2017-12-09: 0.2 mg via ORAL
  Filled 2017-12-09: qty 2

## 2017-12-09 MED ORDER — PREDNISONE 20 MG PO TABS
60.0000 mg | ORAL_TABLET | Freq: Once | ORAL | Status: AC
  Administered 2017-12-09: 60 mg via ORAL
  Filled 2017-12-09: qty 3

## 2017-12-09 NOTE — Discharge Instructions (Addendum)
Please return to the Emergency Department for any new or worsening symptoms or if your symptoms do not improve. Please be sure to follow up with your Primary Care Physician as soon as possible regarding your visit today. If you do not have a Primary Doctor please use the resources below to establish one. Please take your antibiotic medication Keflex as prescribed for the next 10 days for your urinary tract infection.  Your urine has been sent off for culture, this will result in the next 2-3 days. If it grows out bacteria that require treatment with a different antibiotic you will be contacted by [facility]. You may use the Zofran as prescribed for nausea. Please drink plenty of water to avoid dehydration. Your blood pressure was elevated today.  Please follow-up with your primary care provider regarding this.  Please avoid taking your lisinopril/hydrochlorothiazide for the next few days until you follow-up with your primary care doctor because this can worsen your kidney function. Please take prednisone as prescribed for your asthma exacerbation.    RESOURCE GUIDE  Chronic Pain Problems: Contact [facility]  [phone] Patients need to be referred by their primary care doctor.  Insufficient Money for Medicine: Contact United Way:  call "211" or [facility] 5178556087.  No Primary Care Doctor: Call [facility]  [phone] - can help you locate a primary care doctor that  accepts your insurance, provides certain services, etc. Physician Referral Service- [phone]  Agencies that provide inexpensive medical care: [facility]  [phone] [facility]  [phone] [facility]  [phone] [facility]  [phone] [facility]  [phone] [facility]  [phone]  Medicaid-accepting [facility] Providers: [facility]- [address]  [phone], Mon-Fri 9am-7pm, Sat  9am-1pm [facility]- [address] [place], Suite [place]  [phone] [facility]- [address] [place]  [phone] [facility]- [address]  [phone] Renaye Rakers- [address] [place], [address], [phone]  Only accepts [place] patients after they have their name  applied to their card  Self Pay (no insurance) in [facility]: Sickle Cell Patients: Dr Willey Blade, [facility]  [address] [place], [phone] [facility]- [address] [place]  [phone]       [facility]- [address]       -     [facility]- see information above (Speak to [employer] if you do not have insurance)       -  [facility]- [address] [place], [phone]       -  [facility]- [address],  [phone]       -  [facility]- [address], [phone]       -  Dr Julio Sicks-  [address], [place], [phone]       -  [facility]- [address] [phone]       -  [facility]- [address], [phone], also [address] [phone]       -    [facility]- [address] [place], [phone], 1st & 3rd Saturday   every month, 10am-1pm  1) Find a Doctor and Pay Out of Pocket Although you  won't have to find out who is covered by your insurance plan, it is a good idea to ask around and get recommendations. You will then need to call the office and see if the doctor you have chosen will accept you as a new patient and what types of options they offer for patients who are self-pay. Some doctors offer discounts or will set up payment plans for their patients who do not have insurance, but you will need to ask so you aren't surprised when you get to your appointment.  2) Contact Your Local Health Department Not all health departments have doctors that can see patients for sick visits,  but many do, so it is worth a call to see if yours does. If you don't know where your local health department is, you can check in your phone book. The CDC also has a tool to help you locate your state's health department, and many state websites also have listings of all of their local health departments.  3) Find a Walk-in Clinic If your illness is not likely to be very severe or complicated, you may want to try a walk in clinic. These are popping up all over the country in pharmacies, drugstores, and shopping centers. They're usually staffed by nurse practitioners or physician assistants that have been trained to treat common illnesses and complaints. They're usually fairly quick and inexpensive. However, if you have serious medical issues or chronic medical problems, these are probably not your best option  STD Testing [facility] Department of [facility], STD Clinic, [address] [place], phone [phone] or [phone].  Monday - Friday, call for an appointment. [facility] Department of [employer], STD Clinic, [place] [address], [place], phone [phone] or [phone].  Monday - Friday, call for an appointment.  Abuse/Neglect: [facility] Child Abuse Hotline 214 250 9360 [facility] Child Abuse Hotline 321-046-1444 (After Hours)  Emergency Shelter:  [facility] [phone]  Maternity Homes: Room at the [place] [facility] 559-231-3546 [facility] [phone]  MRSA Hotline #:   669 348 8401  [facility] Resources  [facility] [place]  [employer] [facility] Dept. [address]                 [address]         [address]  Blondell Reveal Phone:  [phone]                                  Phone:  [phone]                   Phone:  [phone]  [facility], [phone] [facility] - [phone]       -     [facility] in [place], [address]  [phone], [place] [phone] or [phone] (After Hours)   [place]  Substance Abuse Resources: Alcohol and Drug Services  [place] 205-104-6995 [facility] Chinita Pester [phone] Residential & Outpatient Substance Abuse Program  [phone]  Psychological Services: [place]  [phone] [place]  [place], [place] [address] [place], [place]: [phone] or [phone], [email or web address]  Dental Assistance  If unable to pay or uninsured, contact:  [facility] or [facility]. to become qualified for the adult dental clinic.  Patients with Medicaid: [facility] [phone] [address], [place] [address], [phone]  If unable to pay, or uninsured, contact [facility] [phone]) or [employer] [phone] in [place], [place] in [facility]) to become qualified for the adult dental clinic   Other [place]- [place], [place], [place], [address], [place], [place], 2nd and 4th Thursday of the month at 6:30am.  10 clients each day by appointment, can sometimes see walk-in patients if someone does not show for an appointment. [facility]- [address] Hillard Danker [place], [place], [address], [place], [place], [place], [address], [place] Department- [phone] [facility] [phone]

## 2017-12-09 NOTE — ED Notes (Addendum)
PT called out requesting a breathing treatment. Assessed breath sounds. See assessment. Notified PA.

## 2017-12-09 NOTE — ED Triage Notes (Signed)
Patient states she had a "cold " 4 days ago and then asthma set in and began having N/V x 3 days.

## 2017-12-09 NOTE — ED Provider Notes (Signed)
Sign out from [facility], PA-C at shift change See previous providers note for full H&P  Briefly, patient has had nausea and emesis for the past 3 days.  Patient has mild anemia which was replaced in the ED.  UA shows UTI.  Keflex prescribed by previous provider.  Patient is well-appearing and tolerating oral fluids.  After IV fluids, and blood pressure medication (which she has not been able to take for the past 3 days), plan for reevaluation after blood pressure decreased.  Plan for discharge home, if decreased blood pressure and continues to tolerate PO.   4:58 PM Called the patient's room and reported shortness of breath and wheezing.  Patient uses inhaler at home for asthma, as needed.  She reports she feels like she is having an asthma attack.  On auscultation, patient with inspiratory and expiratory wheezing.  Will order DuoNeb and prednisone 60 mg.  Oxygen saturations high 90s on room air.  Patient is feeling much better after DuoNeb.  Her lung exam is much improved with only a few faint wheezes auscultated.  Will discharge home with 5-day burst of prednisone in addition to the Keflex and Zofran prescribed by Apolinar Junes.  Blood pressure has decreased in the ED, patient states she is feeling much better and would like to go home.  Follow-up to PCP for recheck in the next few days.  Return precautions discussed.  Patient understands and agrees with plan.  Patient vitals stable throughout ED course and discharged in satisfactory condition.   Emi Holes, PA-C 12/09/17 Arnoldo Hooker, MD 12/10/17 [phone]

## 2017-12-09 NOTE — ED Notes (Signed)
Pt is aware of high BP. States she has not been able to take her meds in 4 days due to emesis. RN made aware

## 2017-12-09 NOTE — ED Provider Notes (Signed)
[facility]-EMERGENCY DEPT Provider Note   CSN: [id number] Arrival date & time: 12/09/17  1144     History   Chief Complaint Chief Complaint  Patient presents with  . Emesis    HPI COREAN YOSHIMURA is a 62 y.o. female presenting for recurrent emesis that began on Friday, 3 days ago.  Patient states that her symptoms began on Thursday with rhinorrhea, congestion and cough.  Patient states that she took a "cold pill" which relieved her symptoms however the next day she developed vomiting.  Patient states that she has not been able to keep any food down over the past 3 days.  Patient states that her vomit consists of food that she just previously eaten, denies bilious or bloody emesis.  Patient denies any and all pain, states that her stomach feels "sore" when vomiting however feels fine just after vomiting. Patient also endorses some diarrhea at that occurred approximately 3 times in the past 2 days.  Denies blood in her stool.  Patient denies fever, abdominal pain, chest pain, shortness of breath, rash, leg swelling.  Additionally patient states that she has not taking her home medications in the past 4 days due to vomiting.  HPI  Past Medical History:  Diagnosis Date  . Arthritis   . Asthma    INFFREQUENT PROBLEMS - DOES NOT HAVE INHALER  . Complication of anesthesia   . Depression   . Difficulty sleeping   . Genital herpes   . History of transfusion   . Hypertension   . PONV (postoperative nausea and vomiting)     Patient Active Problem List   Diagnosis Date Noted  . S/P left TKA 05/17/2014  . S/P knee replacement 05/17/2014    Past Surgical History:  Procedure Laterality Date  . ABDOMINAL HYSTERECTOMY  2001  . ABORATIONS     X2  . JOINT REPLACEMENT  2007   RT KNEE  . KNEE ARTHROSCOPY  2005   rt knee  . PARTIAL HIP ARTHROPLASTY Right   . TOTAL KNEE ARTHROPLASTY Left 05/17/2014   Procedure: LEFT TOTAL KNEE ARTHROPLASTY;  Surgeon: Shelda Pal, MD;  Location: [facility];  Service: Orthopedics;  Laterality: Left;     OB History   None      Home Medications    Prior to Admission medications   Medication Sig Start Date End Date Taking? Authorizing Provider  amitriptyline (ELAVIL) 25 MG tablet Take 25 mg by mouth at bedtime. 09/12/17  Yes [provider]  atenolol (TENORMIN) 50 MG tablet Take 50 mg by mouth 2 (two) times daily.    Yes [provider]  cloNIDine (CATAPRES) 0.2 MG tablet Take 0.2 mg by mouth 2 (two) times daily.   Yes [provider]  HYDROcodone-acetaminophen (NORCO) 7.5-325 MG per tablet Take 1-2 tablets by mouth every 4 (four) hours as needed for moderate pain. 05/18/14  Yes Babish, Molli Hazard, PA-C  ibuprofen (ADVIL,MOTRIN) 800 MG tablet Take 800 mg by mouth 3 (three) times daily as needed for moderate pain.  09/26/17  Yes [provider]  lisinopril-hydrochlorothiazide (PRINZIDE,ZESTORETIC) 20-25 MG per tablet Take 1 tablet by mouth daily.    Yes [provider]  methocarbamol (ROBAXIN) 500 MG tablet Take 1 tablet (500 mg total) by mouth every 6 (six) hours as needed for muscle spasms. 05/18/14  Yes Babish, Molli Hazard, PA-C  traZODone (DESYREL) 50 MG tablet Take 50-150 mg by mouth at bedtime as needed for sleep.   Yes [provider]  cephALEXin (KEFLEX) 500 MG capsule Take 1 capsule (500 mg total) by mouth 2 (two) times daily. 12/09/17   Harlene Salts A, PA-C  docusate sodium (COLACE) 100 MG capsule Take 1 capsule (100 mg total) by mouth 2 (two) times daily. Patient not taking: Reported on 12/09/2017 05/18/14   Lanney Gins, PA-C  ferrous sulfate 325 (65 FE) MG tablet Take 1 tablet (325 mg total) by mouth 3 (three) times daily after meals. Patient not taking: Reported on 12/09/2017 05/18/14   Lanney Gins, PA-C  ondansetron (ZOFRAN ODT) 4 MG disintegrating tablet Take 1 tablet (4 mg total) by mouth every 8 (eight) hours as needed for nausea or vomiting. 12/09/17    Harlene Salts A, PA-C  polyethylene glycol (MIRALAX / GLYCOLAX) packet Take 17 g by mouth 2 (two) times daily. Patient not taking: Reported on 12/09/2017 05/18/14   Lanney Gins, PA-C    Family History Family History  Problem Relation Age of Onset  . Diabetes Mother   . High Cholesterol Mother   . Kidney Stones Mother     Social History Social History   Tobacco Use  . Smoking status: Never Smoker  . Smokeless tobacco: Never Used  Substance Use Topics  . Alcohol use: No  . Drug use: No     Allergies   Darvon [propoxyphene] and Sulfa antibiotics   Review of Systems Review of Systems  Constitutional: Negative.  Negative for chills, fatigue and fever.  HENT: Positive for congestion and rhinorrhea. Negative for facial swelling, sore throat, trouble swallowing and voice change.   Respiratory: Negative.  Negative for shortness of breath.   Cardiovascular: Negative.  Negative for chest pain.  Gastrointestinal: Positive for diarrhea, nausea and vomiting. Negative for abdominal pain and blood in stool.  Genitourinary: Negative.  Negative for dysuria, hematuria, vaginal bleeding and vaginal discharge.  Skin: Negative.  Negative for rash.  Neurological: Negative.  Negative for dizziness, syncope, weakness, numbness and headaches.  All other systems reviewed and are negative.    Physical Exam Updated Vital Signs BP (!) 162/103   Pulse 71   Temp 97.8 F (36.6 C) (Oral)   Resp 16   Ht 5\' 10"  (1.778 m)   Wt 93 kg   SpO2 100%   BMI 29.41 kg/m   Physical Exam  Constitutional: She is oriented to person, place, and time. She appears well-developed and well-nourished. No distress.  HENT:  Head: Normocephalic and atraumatic.  Right Ear: External ear normal.  Left Ear: External ear normal.  Nose: Nose normal.  Mouth/Throat: Uvula is midline, oropharynx is clear and moist and mucous membranes are normal. No trismus in the jaw. No uvula swelling. No tonsillar abscesses.    Eyes: Pupils are equal, round, and reactive to light. Conjunctivae and EOM are normal.  Neck: Trachea normal, normal range of motion, full passive range of motion without pain and phonation normal. Neck supple. No tracheal deviation present.  Cardiovascular: Normal rate, regular rhythm, normal heart sounds and intact distal pulses.  Pulmonary/Chest: Effort normal and breath sounds normal. No respiratory distress. She has no wheezes. She exhibits no tenderness.  Abdominal: Soft. Bowel sounds are normal. There is no tenderness. There is no rigidity, no rebound and no guarding.  Musculoskeletal: Normal range of motion. She exhibits no edema or tenderness.  Neurological: She is alert and oriented to person, place, and time. No sensory deficit.  Skin: Skin is warm and dry.  Psychiatric: She has a normal mood and affect. Her behavior is normal.  ED Treatments / Results  Labs (all labs ordered are listed, but only abnormal results are displayed) Labs Reviewed  COMPREHENSIVE METABOLIC PANEL - Abnormal; Notable for the following components:      Result Value   Potassium 3.1 (*)    Glucose, Bld 147 (*)    Creatinine, Ser 1.45 (*)    Total Protein 9.3 (*)    GFR calc non Af Amer 38 (*)    GFR calc Af Amer 44 (*)    All other components within normal limits  CBC - Abnormal; Notable for the following components:   RBC 5.50 (*)    Hemoglobin 17.8 (*)    HCT 49.6 (*)    All other components within normal limits  URINALYSIS, ROUTINE W REFLEX MICROSCOPIC - Abnormal; Notable for the following components:   Color, Urine AMBER (*)    APPearance CLOUDY (*)    Hgb urine dipstick LARGE (*)    Ketones, ur 20 (*)    Protein, ur 100 (*)    Nitrite POSITIVE (*)    Leukocytes, UA SMALL (*)    WBC, UA >50 (*)    Bacteria, UA MANY (*)    All other components within normal limits  URINE CULTURE  LIPASE, BLOOD    EKG None  Radiology Dg Chest 2 View  Result Date: 12/09/2017 CLINICAL DATA:  Recent  URI.  Cold, asthma. EXAM: CHEST - 2 VIEW COMPARISON:  None. FINDINGS: Heart and mediastinal contours are within normal limits. No focal opacities or effusions. No acute bony abnormality. IMPRESSION: No active cardiopulmonary disease. Electronically Signed   By: Charlett Nose M.D.   On: 12/09/2017 14:51    Procedures Procedures (including critical care time)  Medications Ordered in ED Medications  sodium chloride 0.9 % bolus 1,000 mL (1,000 mLs Intravenous New Bag/Given 12/09/17 1606)  potassium chloride SA (K-DUR,KLOR-CON) CR tablet 20 mEq (has no administration in time range)  sodium chloride 0.9 % bolus 1,000 mL (0 mLs Intravenous Stopped 12/09/17 1516)  ondansetron (ZOFRAN) injection 4 mg (4 mg Intravenous Given 12/09/17 1538)  cloNIDine (CATAPRES) tablet 0.2 mg (0.2 mg Oral Given 12/09/17 1607)  metoprolol tartrate (LOPRESSOR) injection 5 mg (5 mg Intravenous Given 12/09/17 1607)     Initial Impression / Assessment and Plan / ED Course  I have reviewed the triage vital signs and the nursing notes.  Pertinent labs & imaging results that were available during my care of the patient were reviewed by me and considered in my medical decision making (see chart for details).  Clinical Course as of Dec 10 1607  Mon Dec 09, 2017  1536 On reevaluation after first liter bolus patient states that she is feeling well, denying any and all pain at this time.  States that she has not had any nausea or vomiting since she is been in the department.  Lab work including CMP, lipase, CBC and chest x-ray reviewed with Dr. Patria Mane.  It appears that the patient is dehydrated at this time.  Dr. Patria Mane advises giving the patient another liter of IV fluid here in department, home dose of clonidine and 5 mg of IV Lopressor.  Avoid hydrochlorothiazide/lisinopril at this time due to increased creatinine.   [BM]    Clinical Course User Index [BM] Bill Salinas, PA-C   Patient presenting with nausea/vomiting for the  past 3 days and diarrhea for the past 2 days.  Patient is well-appearing, no acute distress, denying any and all pain at time of evaluation and  reevaluation.  It appears that patient is most likely experiencing a viral gastroenteritis following her viral upper respiratory infection last week.  Patient with dehydration on labs here emergency department, has not been taking home blood pressure medications.    CBC nonacute, most likely hemoconcentration due to dehydration Lipase within normal limits CMP shows increased creatinine and decreased potassium Urinalysis shows many bacteria, greater than 50 white blood cells and nitrite positive.  Patient without urinary complaints.  IV fluids begun in emergency department. IV Lopressor and home dose of clonidine given to lower patient's blood pressure. Patient is afebrile, not tachycardic, well-appearing. Patient informed of care plan of rehydration and lowering blood pressure, control nausea with Zofran and oral challenge. Patient informed of urinary tract infection, need for antibiotics, Keflex 500 twice daily x7 days prescribed.   Care handoff given to [facility] law PA-C at shift change.  Plan at this time is to: 1. continue IV fluids for dehydration  2. lower patient's blood pressure 3. control nausea with Zofran and p.o. Challenge  Pending reassessment discharged with ODT Zofran and Keflex with outpatient follow-up.    Patient's case discussed with Dr. Patria Mane who agrees with plan at this time.    Note: Portions of this report may have been transcribed using voice recognition software. Every effort was made to ensure accuracy; however, inadvertent computerized transcription errors may still be present.   Final Clinical Impressions(s) / ED Diagnoses   Final diagnoses:  Nausea vomiting and diarrhea  Dehydration  Urinary tract infection with hematuria, site unspecified  Elevated blood pressure reading    ED Discharge Orders          Ordered    cephALEXin (KEFLEX) 500 MG capsule  2 times daily     12/09/17 1551    ondansetron (ZOFRAN ODT) 4 MG disintegrating tablet  Every 8 hours PRN     12/09/17 1555           Elizabeth Palau 12/09/17 1610    Azalia Bilis, MD 12/09/17 1646

## 2017-12-12 LAB — URINE CULTURE: Culture: >=100000 — AB

## 2017-12-13 ENCOUNTER — Telehealth: Payer: Self-pay | Admitting: *Deleted

## 2017-12-13 NOTE — Telephone Encounter (Signed)
Post ED Visit - Positive Culture Follow-up  Culture report reviewed by antimicrobial stewardship pharmacist:  []  Enzo BiNathan Batchelder, Pharm.D. []  Celedonio MiyamotoJeremy Frens, Pharm.D., BCPS AQ-ID [x]  Garvin FilaMike Maccia, Pharm.D., BCPS []  Georgina PillionElizabeth Martin, Pharm.D., BCPS []  SanibelMinh Pham, [place].D., BCPS, AAHIVP []  Estella HuskMichelle Turner, Pharm.D., BCPS, AAHIVP []  Lysle Pearlachel Rumbarger, PharmD, BCPS []  Phillips Climeshuy Dang, PharmD, BCPS []  Agapito GamesAlison Masters, PharmD, BCPS []  Verlan FriendsErin Deja, PharmD  Positive urine culture Treated with Cephalexin, organism sensitive to the same and no further patient follow-up is required at this time.  Virl AxeRobertson, Admir Candelas [facility] 12/13/2017, 2:12 PM

## 2018-03-27 DIAGNOSIS — Z96641 Presence of right artificial hip joint: Secondary | ICD-10-CM | POA: Insufficient documentation

## 2018-04-02 HISTORY — PX: BREAST BIOPSY: SHX20

## 2018-04-10 ENCOUNTER — Other Ambulatory Visit: Payer: Self-pay | Admitting: Family Medicine

## 2018-04-10 DIAGNOSIS — Z1231 Encounter for screening mammogram for malignant neoplasm of breast: Secondary | ICD-10-CM

## 2018-04-16 ENCOUNTER — Other Ambulatory Visit: Payer: Self-pay | Admitting: Family Medicine

## 2018-04-16 DIAGNOSIS — N6310 Unspecified lump in the right breast, unspecified quadrant: Secondary | ICD-10-CM

## 2018-04-16 DIAGNOSIS — I10 Essential (primary) hypertension: Secondary | ICD-10-CM | POA: Insufficient documentation

## 2018-04-16 DIAGNOSIS — M2170 Unequal limb length (acquired), unspecified site: Secondary | ICD-10-CM | POA: Insufficient documentation

## 2018-04-24 ENCOUNTER — Inpatient Hospital Stay: Admission: RE | Admit: 2018-04-24 | Payer: BLUE CROSS/BLUE SHIELD | Source: Ambulatory Visit

## 2018-05-02 DIAGNOSIS — E042 Nontoxic multinodular goiter: Secondary | ICD-10-CM | POA: Insufficient documentation

## 2018-06-09 DIAGNOSIS — T84099A Other mechanical complication of unspecified internal joint prosthesis, initial encounter: Secondary | ICD-10-CM | POA: Insufficient documentation

## 2018-09-26 ENCOUNTER — Encounter (HOSPITAL_COMMUNITY): Payer: Self-pay

## 2018-09-26 ENCOUNTER — Emergency Department (HOSPITAL_COMMUNITY)
Admission: EM | Admit: 2018-09-26 | Discharge: 2018-09-26 | Disposition: A | Discharge status: Home / Self Care | Payer: BLUE CROSS/BLUE SHIELD | Attending: Emergency Medicine | Admitting: Emergency Medicine

## 2018-09-26 ENCOUNTER — Emergency Department (HOSPITAL_COMMUNITY): Payer: BLUE CROSS/BLUE SHIELD

## 2018-09-26 ENCOUNTER — Other Ambulatory Visit: Payer: Self-pay

## 2018-09-26 DIAGNOSIS — T84020A Dislocation of internal right hip prosthesis, initial encounter: Secondary | ICD-10-CM | POA: Diagnosis not present

## 2018-09-26 DIAGNOSIS — I10 Essential (primary) hypertension: Secondary | ICD-10-CM | POA: Diagnosis not present

## 2018-09-26 DIAGNOSIS — Z96643 Presence of artificial hip joint, bilateral: Secondary | ICD-10-CM | POA: Diagnosis not present

## 2018-09-26 DIAGNOSIS — Z79899 Other long term (current) drug therapy: Secondary | ICD-10-CM | POA: Insufficient documentation

## 2018-09-26 DIAGNOSIS — Y9389 Activity, other specified: Secondary | ICD-10-CM | POA: Insufficient documentation

## 2018-09-26 DIAGNOSIS — Y929 Unspecified place or not applicable: Secondary | ICD-10-CM | POA: Insufficient documentation

## 2018-09-26 DIAGNOSIS — S79811A Other specified injuries of right hip, initial encounter: Secondary | ICD-10-CM | POA: Diagnosis present

## 2018-09-26 DIAGNOSIS — J45909 Unspecified asthma, uncomplicated: Secondary | ICD-10-CM | POA: Insufficient documentation

## 2018-09-26 DIAGNOSIS — Y828 Other medical devices associated with adverse incidents: Secondary | ICD-10-CM | POA: Diagnosis not present

## 2018-09-26 DIAGNOSIS — Y999 Unspecified external cause status: Secondary | ICD-10-CM | POA: Insufficient documentation

## 2018-09-26 DIAGNOSIS — S73004A Unspecified dislocation of right hip, initial encounter: Secondary | ICD-10-CM

## 2018-09-26 MED ORDER — PROPOFOL 10 MG/ML IV BOLUS
0.5000 mg/kg | Freq: Once | INTRAVENOUS | Status: DC
  Filled 2018-09-26: qty 20

## 2018-09-26 MED ORDER — KETAMINE HCL 10 MG/ML IJ SOLN
INTRAMUSCULAR | Status: AC | PRN
  Administered 2018-09-26: 20 mg via INTRAVENOUS

## 2018-09-26 MED ORDER — SODIUM CHLORIDE 0.9 % IV BOLUS
1000.0000 mL | Freq: Once | INTRAVENOUS | Status: AC
  Administered 2018-09-26: 1000 mL via INTRAVENOUS

## 2018-09-26 MED ORDER — PROPOFOL 10 MG/ML IV BOLUS
INTRAVENOUS | Status: AC | PRN
  Administered 2018-09-26 (×2): 20 mg via INTRAVENOUS

## 2018-09-26 MED ORDER — HYDROMORPHONE HCL 1 MG/ML IJ SOLN
0.5000 mg | Freq: Once | INTRAMUSCULAR | Status: AC
  Administered 2018-09-26: 0.5 mg via INTRAVENOUS
  Filled 2018-09-26: qty 1

## 2018-09-26 MED ORDER — KETAMINE HCL 50 MG/5ML IJ SOSY
1.0000 mg/kg | PREFILLED_SYRINGE | Freq: Once | INTRAMUSCULAR | Status: DC
  Filled 2018-09-26: qty 10

## 2018-09-26 MED ORDER — HYDROMORPHONE HCL 1 MG/ML IJ SOLN
1.0000 mg | Freq: Once | INTRAMUSCULAR | Status: AC
  Administered 2018-09-26: 1 mg via INTRAVENOUS
  Filled 2018-09-26: qty 1

## 2018-09-26 MED ORDER — KETAMINE HCL 10 MG/ML IJ SOLN
INTRAMUSCULAR | Status: AC | PRN
  Administered 2018-09-26: 40 mg via INTRAVENOUS

## 2018-09-26 MED ORDER — PROPOFOL 10 MG/ML IV BOLUS
INTRAVENOUS | Status: AC | PRN
  Administered 2018-09-26: 40 mg via INTRAVENOUS

## 2018-09-26 NOTE — ED Triage Notes (Signed)
EMS reports from home, Pt states has had bilateral hip replacement, right side done in April, right leg "popped over to left" during activity. Pt now unable to move right leg and complains of pain.  BP 142/80 HR 88 RR 16 Sp02 96 RA   20ga RAC 4mg  Zoran 216mcg Fentanyl enrounte

## 2018-09-26 NOTE — ED Notes (Signed)
Bed: WA08 Expected date:  Expected time:  Means of arrival:  Comments: EMS-hip dslocation

## 2018-09-26 NOTE — ED Provider Notes (Signed)
[facility]-EMERGENCY DEPT Provider Note  CSN: [id number] Arrival date & time: 09/26/18 1631  Chief Complaint(s) Hip Pain and Hip Injury  HPI Natasha Hunter is a 63 y.o. female with a history of bilateral hip replacements who presents to the emergency department with sudden onset right hip pain that occurred while having intercourse.  This occurred approximately 1 to 2 hours prior to arrival.  She reports feeling a pop and noticing her right leg turning inward.  Since then she has had excruciating pain that is worse with range of motion.  No alleviating factors.  Denies any trauma.  Denies any numbness or loss of sensation.  Denies any other physical complaints.  HPI  Past Medical History Past Medical History:  Diagnosis Date  . Arthritis   . Asthma    INFFREQUENT PROBLEMS - DOES NOT HAVE INHALER  . Complication of anesthesia   . Depression   . Difficulty sleeping   . Genital herpes   . History of transfusion   . Hypertension   . PONV (postoperative nausea and vomiting)    Patient Active Problem List   Diagnosis Date Noted  . S/P left TKA 05/17/2014  . S/P knee replacement 05/17/2014   Home Medication(s) Prior to Admission medications   Medication Sig Start Date End Date Taking? Authorizing Provider  amitriptyline (ELAVIL) 25 MG tablet Take 25 mg by mouth at bedtime. 09/12/17   [provider]  atenolol (TENORMIN) 50 MG tablet Take 50 mg by mouth 2 (two) times daily.     [provider]  cephALEXin (KEFLEX) 500 MG capsule Take 1 capsule (500 mg total) by mouth 2 (two) times daily. 12/09/17   Law, Waylan BogaAlexandra M, PA-C  cloNIDine (CATAPRES) 0.2 MG tablet Take 0.2 mg by mouth 2 (two) times daily.    [provider]  docusate sodium (COLACE) 100 MG capsule Take 1 capsule (100 mg total) by mouth 2 (two) times daily. Patient not taking: Reported on 12/09/2017 05/18/14   Lanney GinsBabish, Matthew, PA-C  ferrous sulfate 325 (65 FE) MG tablet Take 1  tablet (325 mg total) by mouth 3 (three) times daily after meals. Patient not taking: Reported on 12/09/2017 05/18/14   Lanney GinsBabish, Matthew, PA-C  HYDROcodone-acetaminophen (NORCO) 7.5-325 MG per tablet Take 1-2 tablets by mouth every 4 (four) hours as needed for moderate pain. 05/18/14   Lanney GinsBabish, Matthew, PA-C  ibuprofen (ADVIL,MOTRIN) 800 MG tablet Take 800 mg by mouth 3 (three) times daily as needed for moderate pain.  09/26/17   [provider]  lisinopril-hydrochlorothiazide (PRINZIDE,ZESTORETIC) 20-25 MG per tablet Take 1 tablet by mouth daily.     [provider]  methocarbamol (ROBAXIN) 500 MG tablet Take 1 tablet (500 mg total) by mouth every 6 (six) hours as needed for muscle spasms. 05/18/14   Lanney GinsBabish, Matthew, PA-C  ondansetron (ZOFRAN ODT) 4 MG disintegrating tablet Take 1 tablet (4 mg total) by mouth every 8 (eight) hours as needed for nausea or vomiting. 12/09/17   Harlene SaltsMorelli, Brandon A, PA-C  polyethylene glycol (MIRALAX / GLYCOLAX) packet Take 17 g by mouth 2 (two) times daily. Patient not taking: Reported on 12/09/2017 05/18/14   Lanney GinsBabish, Matthew, PA-C  predniSONE (DELTASONE) 20 MG tablet Take 3 tablets (60 mg total) by mouth daily. 12/09/17   Law, Waylan BogaAlexandra M, PA-C  traZODone (DESYREL) 50 MG tablet Take 50-150 mg by mouth at bedtime as needed for sleep.    [provider]  Past Surgical History Past Surgical History:  Procedure Laterality Date  . ABDOMINAL HYSTERECTOMY  2001  . ABORATIONS     X2  . JOINT REPLACEMENT  2007   RT KNEE  . KNEE ARTHROSCOPY  2005   rt knee  . PARTIAL HIP ARTHROPLASTY Right   . TOTAL KNEE ARTHROPLASTY Left 05/17/2014   Procedure: LEFT TOTAL KNEE ARTHROPLASTY;  Surgeon: Shelda Pal, MD;  Location: [facility];  Service: Orthopedics;  Laterality: Left;   Family History Family History  Problem Relation Age of Onset   . Diabetes Mother   . High Cholesterol Mother   . Kidney Stones Mother     Social History Social History   Tobacco Use  . Smoking status: Never Smoker  . Smokeless tobacco: Never Used  Substance Use Topics  . Alcohol use: No  . Drug use: No   Allergies Darvon [propoxyphene] and Sulfa antibiotics  Review of Systems Review of Systems All other systems are reviewed and are negative for acute change except as noted in the HPI  Physical Exam Vital Signs  I have reviewed the triage vital signs BP (!) 169/98   Pulse 79   Temp 97.9 F (36.6 C) (Oral)   Resp 18   Ht  (1.778 m)   Wt 87.5 kg   SpO2 100%   BMI 27.69 kg/m   Physical Exam Vitals signs reviewed.  Constitutional:      General: She is not in acute distress.    Appearance: She is well-developed. She is not diaphoretic.  HENT:     Head: Normocephalic and atraumatic.     Right Ear: External ear normal.     Left Ear: External ear normal.     Nose: Nose normal.  Eyes:     General: No scleral icterus.    Conjunctiva/sclera: Conjunctivae normal.  Neck:     Musculoskeletal: Normal range of motion.     Trachea: Phonation normal.  Cardiovascular:     Rate and Rhythm: Normal rate and regular rhythm.  Pulmonary:     Effort: Pulmonary effort is normal. No respiratory distress.     Breath sounds: No stridor.  Abdominal:     General: There is no distension.  Musculoskeletal: Normal range of motion.     Right hip: She exhibits deformity (inward rotation and leg shortened. ).       Legs:  Neurological:     Mental Status: She is alert and oriented to person, place, and time.  Psychiatric:        Behavior: Behavior normal.     ED Results and Treatments Labs (all labs ordered are listed, but only abnormal results are displayed) Labs Reviewed - No data to display                                                                                                                       EKG  EKG Interpretation   Date/Time:    Ventricular Rate:    PR  Interval:    QRS Duration:   QT Interval:    QTC Calculation:   R Axis:     Text Interpretation:        Radiology Dg Hip Unilat W Or W/o Pelvis 2-3 Views Right  Result Date: 09/26/2018 CLINICAL DATA:  Post reduction EXAM: DG HIP (WITH OR WITHOUT PELVIS) 2-3V RIGHT COMPARISON:  09/26/2018 FINDINGS: Status post right hip replacement. Reduction of previously noted dislocated right femoral component. Alignment within normal limits. No fracture. IMPRESSION: Interval reduction of dislocated right femoral component, now with normal alignment. Electronically Signed   By: Jasmine PangKim  Fujinaga M.D.   On: 09/26/2018 21:22   Dg Hip Unilat W Or W/o Pelvis 2-3 Views Right  Result Date: 09/26/2018 CLINICAL DATA:  63 year old female with right hip pain. EXAM: DG HIP (WITH OR WITHOUT PELVIS) 2-3V RIGHT COMPARISON:  Right hip radiograph dated 07/23/2018 FINDINGS: There is a total right hip arthroplasty. There is superior dislocation of the femoral component in relation to the acetabular cup. There is no acute fracture. Advanced arthritic changes of the left hip. Degenerative changes of the lumbar spine. Several radiopaque densities in the right upper quadrant may represent gallstones versus renal calculi. There is moderate stool throughout the colon. 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 Electronically Signed   By: Elgie CollardArash  Radparvar M.D.   On: 09/26/2018 19:31    Pertinent labs & imaging results that were available during my care of the patient were reviewed by me and considered in my medical decision making (see chart for details).  Medications Ordered in ED Medications  propofol (DIPRIVAN) 10 mg/mL bolus/IV push 43.8 mg (80 mg Intravenous Canceled Entry 09/26/18 2055)  ketamine 50 mg in normal saline 5 mL (10 mg/mL) syringe (has no administration in time range)  HYDROmorphone (DILAUDID) injection 1 mg (1 mg Intravenous Given 09/26/18 1824)  sodium  chloride 0.9 % bolus 1,000 mL (1,000 mLs Intravenous New Bag/Given 09/26/18 1824)  HYDROmorphone (DILAUDID) injection 0.5 mg (0.5 mg Intravenous Given 09/26/18 2024)  propofol (DIPRIVAN) 10 mg/mL bolus/IV push (40 mg Intravenous Given 09/26/18 2037)  ketamine (KETALAR) injection (40 mg Intravenous Given 09/26/18 2037)  propofol (DIPRIVAN) 10 mg/mL bolus/IV push (20 mg Intravenous Given 09/26/18 2044)  ketamine (KETALAR) injection (20 mg Intravenous Given 09/26/18 2041)                                                                                                                                    Procedures .Sedation  Date/Time: 09/26/2018 8:51 PM Performed by: Nira Connardama,  Eduardo, MD Authorized by: Nira Connardama,  Eduardo, MD   Consent:    Consent obtained:  Verbal and written   Consent given by:  Patient   Risks discussed:  Allergic reaction, dysrhythmia, inadequate sedation, nausea, prolonged hypoxia resulting in organ damage, prolonged sedation necessitating reversal, respiratory compromise necessitating ventilatory assistance and intubation and vomiting   Alternatives discussed:  Analgesia without sedation, anxiolysis  and regional anesthesia Universal protocol:    Procedure explained and questions answered to patient or proxy's satisfaction: yes     Relevant documents present and verified: yes     Test results available and properly labeled: yes     Imaging studies available: yes     Required blood products, implants, devices, and special equipment available: yes     Site/side marked: yes     Immediately prior to procedure a time out was called: yes     Patient identity confirmation method:  Verbally with patient Indications:    Procedure necessitating sedation performed by:  Physician performing sedation Pre-sedation assessment:    Time since last food or drink:  2 hrs   ASA classification: class 2 - patient with mild systemic disease     Neck mobility: normal     Mouth opening:   3 or more finger widths   Thyromental distance:  4 finger widths   Mallampati score:  I - soft palate, uvula, fauces, pillars visible   Pre-sedation assessments completed and reviewed: airway patency, cardiovascular function, hydration status, mental status, nausea/vomiting, pain level, respiratory function and temperature     Pre-sedation assessment completed:  09/26/2018 6:51 PM Immediate pre-procedure details:    Reassessment: Patient reassessed immediately prior to procedure     Reviewed: vital signs, relevant labs/tests and NPO status     Verified: bag valve mask available, emergency equipment available, intubation equipment available, IV patency confirmed, oxygen available and suction available   Procedure details (see MAR for exact dosages):    Preoxygenation:  Nasal cannula   Sedation:  Propofol and ketamine   Intra-procedure monitoring:  Blood pressure monitoring, cardiac monitor, continuous pulse oximetry, frequent LOC assessments, frequent vital sign checks and continuous capnometry   Intra-procedure events: none     Total Provider sedation time (minutes):  15 Post-procedure details:    Post-sedation assessment completed:  09/26/2018 9:33 PM   Attendance: Constant attendance by certified staff until patient recovered     Recovery: Patient returned to pre-procedure baseline     Post-sedation assessments completed and reviewed: airway patency, cardiovascular function, hydration status, mental status, nausea/vomiting, pain level, respiratory function and temperature     Patient is stable for discharge or admission: yes     Patient tolerance:  Tolerated well, no immediate complications .Ortho Injury Treatment  Date/Time: 09/26/2018 8:52 PM Performed by: Fatima Blank, MD Authorized by: Fatima Blank, MD   Consent:    Consent obtained:  Written   Consent given by:  Patient   Risks discussed:  Recurrent dislocation and irreducible dislocationInjury location: hip  Location details: right hip Injury type: dislocation Dislocation type: posterior Prosthesis: yes Pre-procedure neurovascular assessment: neurovascularly intact  Patient sedated: Yes. Refer to sedation procedure documentation for details of sedation. Manipulation performed: yes Reduction method: Cpt. Lilia Pro. Reduction successful: yes X-ray confirmed reduction: yes Immobilization: knee immobilizer. Supplies used: cotton padding and elastic bandage Post-procedure neurovascular assessment: post-procedure neurovascularly intact Patient tolerance: patient tolerated the procedure well with no immediate complications     (including critical care time)  Medical Decision Making / ED Course I have reviewed the nursing notes for this encounter and the patient's prior records (if available in EHR or on provided paperwork).  Concerning for dislocation given exam.  Patient provided with pain medicine.  Plain film confirmed dislocation.  Patient was sedated and hip was reduced as above.  Knee immobilizer and crutches provided.  Patient will need to follow-up with her orthopedist next  week.  The patient is safe for discharge with strict return precautions.      Final Clinical Impression(s) / ED Diagnoses Final diagnoses:  Dislocation of right hip, initial encounter [facility](HCC)     The patient appears reasonably screened and/or stabilized for discharge and I doubt any other medical condition or other [facility] requiring further screening, evaluation, or treatment in the ED at this time prior to discharge.  Disposition: Discharge  Condition: Good  I have discussed the results, Dx and Tx plan with the patient who expressed understanding and agree(s) with the plan. Discharge instructions discussed at great length. The patient was given strict return precautions who verbalized understanding of the instructions. No further questions at time of discharge.    ED Discharge Orders    None         Follow Up: Verlin DikeLennon, Kenneth C., MD [address] [place] [address] [phone]  Schedule an appointment as soon as possible for a visit  in 5-7 days     This chart was dictated using voice recognition software.  Despite best efforts to proofread,  errors can occur which can change the documentation meaning.   Nira Connardama,  Eduardo, MD 09/26/18 2136

## 2018-09-26 NOTE — Discharge Instructions (Signed)
Only weight bear to touch on the right leg until you follow up with Dr. Len Childs. Use crutches to assist you.

## 2018-10-07 DIAGNOSIS — S73004A Unspecified dislocation of right hip, initial encounter: Secondary | ICD-10-CM | POA: Insufficient documentation

## 2018-10-26 ENCOUNTER — Emergency Department (HOSPITAL_COMMUNITY): Payer: BLUE CROSS/BLUE SHIELD

## 2018-10-26 ENCOUNTER — Emergency Department (HOSPITAL_COMMUNITY)
Admission: EM | Admit: 2018-10-26 | Discharge: 2018-10-26 | Disposition: A | Discharge status: Home / Self Care | Payer: BLUE CROSS/BLUE SHIELD | Attending: Emergency Medicine | Admitting: Emergency Medicine

## 2018-10-26 DIAGNOSIS — Z96641 Presence of right artificial hip joint: Secondary | ICD-10-CM | POA: Insufficient documentation

## 2018-10-26 DIAGNOSIS — Z79899 Other long term (current) drug therapy: Secondary | ICD-10-CM | POA: Diagnosis not present

## 2018-10-26 DIAGNOSIS — J45909 Unspecified asthma, uncomplicated: Secondary | ICD-10-CM | POA: Diagnosis not present

## 2018-10-26 DIAGNOSIS — Y999 Unspecified external cause status: Secondary | ICD-10-CM | POA: Diagnosis not present

## 2018-10-26 DIAGNOSIS — T84029A Dislocation of unspecified internal joint prosthesis, initial encounter: Secondary | ICD-10-CM | POA: Diagnosis not present

## 2018-10-26 DIAGNOSIS — S79911A Unspecified injury of right hip, initial encounter: Secondary | ICD-10-CM | POA: Diagnosis present

## 2018-10-26 DIAGNOSIS — Y792 Prosthetic and other implants, materials and accessory orthopedic devices associated with adverse incidents: Secondary | ICD-10-CM | POA: Insufficient documentation

## 2018-10-26 DIAGNOSIS — Z96652 Presence of left artificial knee joint: Secondary | ICD-10-CM | POA: Insufficient documentation

## 2018-10-26 DIAGNOSIS — S73004A Unspecified dislocation of right hip, initial encounter: Secondary | ICD-10-CM | POA: Insufficient documentation

## 2018-10-26 DIAGNOSIS — I10 Essential (primary) hypertension: Secondary | ICD-10-CM | POA: Insufficient documentation

## 2018-10-26 DIAGNOSIS — Y92003 Bedroom of unspecified non-institutional (private) residence as the place of occurrence of the external cause: Secondary | ICD-10-CM | POA: Insufficient documentation

## 2018-10-26 DIAGNOSIS — Y9389 Activity, other specified: Secondary | ICD-10-CM | POA: Diagnosis not present

## 2018-10-26 MED ORDER — PROPOFOL 10 MG/ML IV BOLUS
INTRAVENOUS | Status: AC | PRN
  Administered 2018-10-26: 20 mg via INTRAVENOUS
  Administered 2018-10-26: 40 mg via INTRAVENOUS
  Administered 2018-10-26: 20 mg via INTRAVENOUS
  Administered 2018-10-26: 40 mg via INTRAVENOUS

## 2018-10-26 MED ORDER — PROPOFOL 10 MG/ML IV BOLUS
0.5000 mg/kg | INTRAVENOUS | Status: DC | PRN
  Filled 2018-10-26: qty 20

## 2018-10-26 MED ORDER — HYDROMORPHONE HCL 1 MG/ML IJ SOLN
1.0000 mg | Freq: Once | INTRAMUSCULAR | Status: AC
  Administered 2018-10-26: 1 mg via INTRAVENOUS
  Filled 2018-10-26: qty 1

## 2018-10-26 NOTE — Discharge Instructions (Addendum)
Make sure to wear your brace.  Avoid any significant flexion, extension or other extreme movements of your hip.  Follow-up with your orthopedic doctor to discuss further treatment.

## 2018-10-26 NOTE — ED Triage Notes (Signed)
Pt to ED via EMS from home c/o right hip displacement, reports this happened the first of July, Hx hip replacement in April #20 R Forearm. 200 Fentanyl given by EMS.

## 2018-10-26 NOTE — Sedation Documentation (Signed)
X-ray at bedside

## 2018-10-26 NOTE — Sedation Documentation (Addendum)
Right Hip in

## 2018-10-26 NOTE — ED Provider Notes (Addendum)
[facility] DEPT Provider Note   CSN: [id number] Arrival date & time: 10/26/18  1042    History   Chief Complaint Chief Complaint  Patient presents with  . Hip Injury    right     HPI Natasha Hunter is a 63 y.o. female.     HPI Patient presents to the emergency room for evaluation of a probable hip dislocation.  Patient had hip replacement surgery in April of this year at [facility] facility.  Patient had a hip dislocation on June 26.  Patient had her hip relocated in the emergency room.  Patient states she is close to be wearing the knee immobilizer but is unable to do that.  Patient states she was lying in bed today keeping her leg straight when she just rolled over.  Patient felt that her hip popped out of place again.  She is pretty sure it is once again dislocated.  She called EMS and was brought to the emergency room.  She denies any other complaints Past Medical History:  Diagnosis Date  . Arthritis   . Asthma    INFFREQUENT PROBLEMS - DOES NOT HAVE INHALER  . Complication of anesthesia   . Depression   . Difficulty sleeping   . Genital herpes   . History of transfusion   . Hypertension   . PONV (postoperative nausea and vomiting)     Patient Active Problem List   Diagnosis Date Noted  . S/P left TKA 05/17/2014  . S/P knee replacement 05/17/2014    Past Surgical History:  Procedure Laterality Date  . ABDOMINAL HYSTERECTOMY  2001  . ABORATIONS     X2  . JOINT REPLACEMENT  2007   RT KNEE  . KNEE ARTHROSCOPY  2005   rt knee  . PARTIAL HIP ARTHROPLASTY Right   . TOTAL KNEE ARTHROPLASTY Left 05/17/2014   Procedure: LEFT TOTAL KNEE ARTHROPLASTY;  Surgeon: Mauri Pole, MD;  Location: [facility];  Service: Orthopedics;  Laterality: Left;     OB History   No obstetric history on file.      Home Medications    Prior to Admission medications   Medication Sig Start Date End Date Taking? Authorizing Provider   amitriptyline (ELAVIL) 25 MG tablet Take 25 mg by mouth at bedtime. 09/12/17   [provider]  atenolol (TENORMIN) 50 MG tablet Take 50 mg by mouth 2 (two) times daily.     [provider]  cephALEXin (KEFLEX) 500 MG capsule Take 1 capsule (500 mg total) by mouth 2 (two) times daily. 12/09/17   Law, Bea Graff, PA-C  cloNIDine (CATAPRES) 0.2 MG tablet Take 0.2 mg by mouth 2 (two) times daily.    [provider]  docusate sodium (COLACE) 100 MG capsule Take 1 capsule (100 mg total) by mouth 2 (two) times daily. Patient not taking: Reported on 12/09/2017 05/18/14   Danae Orleans, PA-C  ferrous sulfate 325 (65 FE) MG tablet Take 1 tablet (325 mg total) by mouth 3 (three) times daily after meals. Patient not taking: Reported on 12/09/2017 05/18/14   Danae Orleans, PA-C  HYDROcodone-acetaminophen (NORCO) 7.5-325 MG per tablet Take 1-2 tablets by mouth every 4 (four) hours as needed for moderate pain. 05/18/14   Danae Orleans, PA-C  ibuprofen (ADVIL,MOTRIN) 800 MG tablet Take 800 mg by mouth 3 (three) times daily as needed for moderate pain.  09/26/17   [provider]  lisinopril-hydrochlorothiazide (PRINZIDE,ZESTORETIC) 20-25 MG per tablet Take 1  tablet by mouth daily.     [provider]  methocarbamol (ROBAXIN) 500 MG tablet Take 1 tablet (500 mg total) by mouth every 6 (six) hours as needed for muscle spasms. 05/18/14   Lanney GinsBabish, Matthew, PA-C  ondansetron (ZOFRAN ODT) 4 MG disintegrating tablet Take 1 tablet (4 mg total) by mouth every 8 (eight) hours as needed for nausea or vomiting. 12/09/17   Harlene SaltsMorelli, Brandon A, PA-C  polyethylene glycol (MIRALAX / GLYCOLAX) packet Take 17 g by mouth 2 (two) times daily. Patient not taking: Reported on 12/09/2017 05/18/14   Lanney GinsBabish, Matthew, PA-C  predniSONE (DELTASONE) 20 MG tablet Take 3 tablets (60 mg total) by mouth daily. 12/09/17   Law, Waylan BogaAlexandra M, PA-C  traZODone (DESYREL) 50 MG tablet Take 50-150 mg by mouth at bedtime as  needed for sleep.    [provider]    Family History Family History  Problem Relation Age of Onset  . Diabetes Mother   . High Cholesterol Mother   . Kidney Stones Mother     Social History Social History   Tobacco Use  . Smoking status: Never Smoker  . Smokeless tobacco: Never Used  Substance Use Topics  . Alcohol use: No  . Drug use: No     Allergies   Darvon [propoxyphene] and Sulfa antibiotics   Review of Systems Review of Systems   Physical Exam Updated Vital Signs BP 135/87   Pulse 75   Temp 98.5 F (36.9 C) (Oral)   Resp 15   Ht 1.753 m (5\' 9" )   Wt 86.6 kg   SpO2 94%   BMI 28.21 kg/m   Physical Exam Vitals signs and nursing note reviewed.  Constitutional:      General: She is not in acute distress.    Appearance: She is well-developed.  HENT:     Head: Normocephalic and atraumatic.     Right Ear: External ear normal.     Left Ear: External ear normal.  Eyes:     General: No scleral icterus.       Right eye: No discharge.        Left eye: No discharge.     Conjunctiva/sclera: Conjunctivae normal.  Neck:     Musculoskeletal: Neck supple.     Trachea: No tracheal deviation.  Cardiovascular:     Rate and Rhythm: Normal rate and regular rhythm.  Pulmonary:     Effort: Pulmonary effort is normal. No respiratory distress.     Breath sounds: Normal breath sounds. No stridor.  Abdominal:     General: There is no distension.     Palpations: Abdomen is soft. There is no mass.     Tenderness: There is no abdominal tenderness.  Musculoskeletal:        General: No swelling.     Right hip: She exhibits decreased range of motion, tenderness and deformity.  Skin:    General: Skin is warm and dry.     Findings: No rash.  Neurological:     Mental Status: She is alert.     Cranial Nerves: Cranial nerve deficit: no gross deficits.     Comments: Distal neurovascular intact      ED Treatments / Results  Labs (all labs ordered are  listed, but only abnormal results are displayed) Labs Reviewed - No data to display   Radiology Xrays reviewed, initial consistent with dislocation Post reduction films, adequate reduction  Procedures .Sedation  Date/Time: 10/26/2018 12:47 PM Performed by: Linwood DibblesKnapp, Aveen Stansel, MD Authorized  by: Lourine Alberico, MD   Consent:    Linwood Dibblesonsent obtained:  Verbal   Consent given by:  Patient   Risks discussed:  Allergic reaction, dysrhythmia, inadequate sedation, nausea, prolonged hypoxia resulting in organ damage, prolonged sedation necessitating reversal, respiratory compromise necessitating ventilatory assistance and intubation and vomiting   Alternatives discussed:  Analgesia without sedation, anxiolysis and regional anesthesia Universal protocol:    Procedure explained and questions answered to patient or proxy's satisfaction: yes     Relevant documents present and verified: yes     Test results available and properly labeled: yes     Imaging studies available: yes     Required blood products, implants, devices, and special equipment available: yes     Site/side marked: yes     Immediately prior to procedure a time out was called: yes     Patient identity confirmation method:  Verbally with patient Indications:    Procedure necessitating sedation performed by:  Physician performing sedation Pre-sedation assessment:    Time since last food or drink:  4   ASA classification: class 1 - normal, healthy patient     Neck mobility: normal     Mouth opening:  3 or more finger widths   Thyromental distance:  4 finger widths   Mallampati score:  I - soft palate, uvula, fauces, pillars visible   Pre-sedation assessments completed and reviewed: airway patency, cardiovascular function, hydration status, mental status, nausea/vomiting, pain level, respiratory function and temperature     Pre-sedation assessment completed:  10/26/2018 12:47 PM Immediate pre-procedure details:    Reassessment: Patient reassessed  immediately prior to procedure     Reviewed: vital signs, relevant labs/tests and NPO status     Verified: bag valve mask available, emergency equipment available, intubation equipment available, IV patency confirmed, oxygen available and suction available   Procedure details (see MAR for exact dosages):    Preoxygenation:  Nasal cannula   Sedation:  Propofol   Intra-procedure monitoring:  Blood pressure monitoring, cardiac monitor, continuous pulse oximetry, frequent LOC assessments, frequent vital sign checks and continuous capnometry   Intra-procedure events: none     Total Provider sedation time (minutes):  25 Post-procedure details:    Post-sedation assessment completed:  10/26/2018 1:13 PM   Attendance: Constant attendance by certified staff until patient recovered     Recovery: Patient returned to pre-procedure baseline     Post-sedation assessments completed and reviewed: airway patency, cardiovascular function, hydration status, mental status, nausea/vomiting, pain level, respiratory function and temperature     Patient is stable for discharge or admission: yes     Patient tolerance:  Tolerated well, no immediate complications  .Ortho Injury Treatment  Date/Time: 10/26/2018 1:14 PM Performed by: Linwood DibblesKnapp, Mardy Hoppe, MD Authorized by: Linwood DibblesKnapp, Brinsley Wence, MD   Consent:    Consent obtained:  Written   Consent given by:  Patient   Risks discussed:  Irreducible dislocation, nerve damage and recurrent dislocation   Alternatives discussed:  No treatmentInjury location: hip (right hip) Location details: right hip Injury type: dislocation Spontaneous dislocation: yes Prosthesis: yes Pre-procedure neurovascular assessment: neurovascularly intact Pre-procedure distal perfusion: normal Pre-procedure neurological function: normal Pre-procedure range of motion: normal  Patient sedated: Yes. Refer to sedation procedure documentation for details of sedation. Manipulation performed: yes Reduction method:  traction and counter traction (captain morgan) Reduction successful: yes X-ray confirmed reduction: yes Immobilization: knee imobilizer. Post-procedure neurovascular assessment: post-procedure neurovascularly intact Post-procedure distal perfusion: normal Post-procedure neurological function: normal Post-procedure range of motion: normal Patient tolerance: patient  tolerated the procedure well with no immediate complications    (including critical care time)  Medications Ordered in ED Medications  propofol (DIPRIVAN) 10 mg/mL bolus/IV push 43.3 mg (has no administration in time range)  propofol (DIPRIVAN) 10 mg/mL bolus/IV push (20 mg Intravenous Given 10/26/18 1259)  HYDROmorphone (DILAUDID) injection 1 mg (1 mg Intravenous Given 10/26/18 1118)     Initial Impression / Assessment and Plan / ED Course  I have reviewed the triage vital signs and the nursing notes.  Pertinent labs & imaging results that were available during my care of the patient were reviewed by me and considered in my medical decision making (see chart for details).  Clinical Course as of Oct 26 1315  Sun Oct 26, 2018  1243 Xray shows recurrent dislocation.  Will proceed with sedation and attempt to reduce   [JK]    Clinical Course User Index [JK] Linwood DibblesKnapp, Levar Fayson, MD    Patient presented to the ED for evaluation of a probable hip dislocation.  X-rays did confirm recurrent hip dislocation of her prosthesis.  Patient underwent propofol sedation in the ED.  I personally performed the sedation as well as the reduction with the assistance of respiratory therapy orthopedic technician, and nursing staff.  Plan on discharge home with her knee immobilizer.  Stressed the importance of limited mobility of her right hip.  Follow-up with her orthopedist.  Final Clinical Impressions(s) / ED Diagnoses   Final diagnoses:  Dislocation of right hip, initial encounter [facility](HCC)  Dislocation of hip joint prosthesis, initial encounter [facility](HCC)     ED Discharge Orders    None       Linwood DibblesKnapp, Raelle Chambers, MD 10/26/18 1317    Linwood DibblesKnapp, Goro Wenrick, MD 10/31/18 [phone]

## 2018-10-26 NOTE — Sedation Documentation (Signed)
Pt alert and oriented x 4, laughing and joking with this nurse, watching television and has contacted her daughter to update her on POC.

## 2018-10-26 NOTE — ED Notes (Signed)
Pt d/c home per MD order. Discharge summary reviewed, pt verbalizes understanding. Pt daughter here for discharge ride home. Pt off unit via wheelchair. Voicing no complaints at discharge.

## 2019-04-13 DIAGNOSIS — M65962 Unspecified synovitis and tenosynovitis, left lower leg: Secondary | ICD-10-CM | POA: Insufficient documentation

## 2019-04-13 DIAGNOSIS — M659 Synovitis and tenosynovitis, unspecified: Secondary | ICD-10-CM | POA: Insufficient documentation

## 2019-04-13 DIAGNOSIS — M25562 Pain in left knee: Secondary | ICD-10-CM | POA: Insufficient documentation

## 2019-07-03 DIAGNOSIS — M7062 Trochanteric bursitis, left hip: Secondary | ICD-10-CM | POA: Insufficient documentation

## 2019-08-13 ENCOUNTER — Other Ambulatory Visit: Payer: Self-pay | Admitting: Family Medicine

## 2019-08-13 DIAGNOSIS — Z1231 Encounter for screening mammogram for malignant neoplasm of breast: Secondary | ICD-10-CM

## 2020-03-23 ENCOUNTER — Ambulatory Visit: Payer: Self-pay | Attending: Internal Medicine

## 2020-03-23 ENCOUNTER — Other Ambulatory Visit (HOSPITAL_COMMUNITY): Payer: Self-pay | Admitting: Internal Medicine

## 2020-03-23 DIAGNOSIS — Z23 Encounter for immunization: Secondary | ICD-10-CM

## 2020-03-23 NOTE — Progress Notes (Signed)
   Covid-19 Vaccination Clinic  Name:  MALIE KASHANI    MRN: [id number] DOB: [birth year]  03/23/2020  Ms. Magley was observed post Covid-19 immunization for 15 minutes without incident. She was provided with Vaccine Information Sheet and instruction to access the V-Safe system.   Ms. Shimabukuro was instructed to call 911 with any severe reactions post vaccine: Marland Kitchen Difficulty breathing  . Swelling of face and throat  . A fast heartbeat  . A bad rash all over body  . Dizziness and weakness   Immunizations Administered    Name Date Dose VIS Date Route   Pfizer COVID-19 Vaccine 03/23/2020 12:45 PM 0.3 mL 01/20/2020 Intramuscular   Manufacturer: [employer], [employer]   Lot: [id number]   NDC: [address]-0

## 2020-04-08 DIAGNOSIS — R310 Gross hematuria: Secondary | ICD-10-CM | POA: Diagnosis not present

## 2020-04-20 DIAGNOSIS — R310 Gross hematuria: Secondary | ICD-10-CM | POA: Diagnosis not present

## 2020-04-26 DIAGNOSIS — I10 Essential (primary) hypertension: Secondary | ICD-10-CM | POA: Diagnosis not present

## 2020-04-26 DIAGNOSIS — Z1231 Encounter for screening mammogram for malignant neoplasm of breast: Secondary | ICD-10-CM | POA: Diagnosis not present

## 2020-04-26 DIAGNOSIS — R3121 Asymptomatic microscopic hematuria: Secondary | ICD-10-CM | POA: Diagnosis not present

## 2020-04-26 DIAGNOSIS — E782 Mixed hyperlipidemia: Secondary | ICD-10-CM | POA: Diagnosis not present

## 2020-04-26 DIAGNOSIS — K838 Other specified diseases of biliary tract: Secondary | ICD-10-CM | POA: Diagnosis not present

## 2020-04-26 DIAGNOSIS — I700 Atherosclerosis of aorta: Secondary | ICD-10-CM | POA: Diagnosis not present

## 2020-04-27 ENCOUNTER — Other Ambulatory Visit: Payer: Self-pay | Admitting: Family Medicine

## 2020-04-27 DIAGNOSIS — Z1231 Encounter for screening mammogram for malignant neoplasm of breast: Secondary | ICD-10-CM

## 2020-05-03 DIAGNOSIS — R310 Gross hematuria: Secondary | ICD-10-CM | POA: Diagnosis not present

## 2020-05-04 ENCOUNTER — Other Ambulatory Visit: Payer: Self-pay | Admitting: Gastroenterology

## 2020-05-04 DIAGNOSIS — R932 Abnormal findings on diagnostic imaging of liver and biliary tract: Secondary | ICD-10-CM

## 2020-05-04 DIAGNOSIS — K5901 Slow transit constipation: Secondary | ICD-10-CM | POA: Diagnosis not present

## 2020-05-06 ENCOUNTER — Other Ambulatory Visit: Payer: Self-pay

## 2020-05-10 DIAGNOSIS — R932 Abnormal findings on diagnostic imaging of liver and biliary tract: Secondary | ICD-10-CM | POA: Diagnosis not present

## 2020-05-10 DIAGNOSIS — R3129 Other microscopic hematuria: Secondary | ICD-10-CM | POA: Diagnosis not present

## 2020-05-18 DIAGNOSIS — S61217A Laceration without foreign body of left little finger without damage to nail, initial encounter: Secondary | ICD-10-CM | POA: Diagnosis not present

## 2020-05-20 ENCOUNTER — Ambulatory Visit
Admission: RE | Admit: 2020-05-20 | Discharge: 2020-05-20 | Disposition: A | Discharge status: Home / Self Care | Payer: Medicare Other | Source: Ambulatory Visit | Attending: Gastroenterology | Admitting: Gastroenterology

## 2020-05-20 ENCOUNTER — Other Ambulatory Visit: Payer: Self-pay

## 2020-05-20 DIAGNOSIS — R932 Abnormal findings on diagnostic imaging of liver and biliary tract: Secondary | ICD-10-CM

## 2020-05-20 DIAGNOSIS — K8020 Calculus of gallbladder without cholecystitis without obstruction: Secondary | ICD-10-CM | POA: Diagnosis not present

## 2020-05-20 DIAGNOSIS — K838 Other specified diseases of biliary tract: Secondary | ICD-10-CM | POA: Diagnosis not present

## 2020-05-20 DIAGNOSIS — R935 Abnormal findings on diagnostic imaging of other abdominal regions, including retroperitoneum: Secondary | ICD-10-CM | POA: Diagnosis not present

## 2020-05-20 DIAGNOSIS — R140 Abdominal distension (gaseous): Secondary | ICD-10-CM | POA: Diagnosis not present

## 2020-05-20 MED ORDER — GADOBENATE DIMEGLUMINE 529 MG/ML IV SOLN
20.0000 mL | Freq: Once | INTRAVENOUS | Status: AC | PRN
  Administered 2020-05-20: 20 mL via INTRAVENOUS

## 2020-05-25 ENCOUNTER — Other Ambulatory Visit: Payer: Self-pay

## 2020-06-06 DIAGNOSIS — H43811 Vitreous degeneration, right eye: Secondary | ICD-10-CM | POA: Diagnosis not present

## 2020-06-06 DIAGNOSIS — H2513 Age-related nuclear cataract, bilateral: Secondary | ICD-10-CM | POA: Diagnosis not present

## 2020-06-06 DIAGNOSIS — H20023 Recurrent acute iridocyclitis, bilateral: Secondary | ICD-10-CM | POA: Diagnosis not present

## 2020-06-09 ENCOUNTER — Other Ambulatory Visit: Payer: Self-pay

## 2020-06-09 ENCOUNTER — Inpatient Hospital Stay: Admission: RE | Admit: 2020-06-09 | Payer: Self-pay | Source: Ambulatory Visit

## 2020-06-09 ENCOUNTER — Ambulatory Visit (HOSPITAL_COMMUNITY)
Admission: EM | Admit: 2020-06-09 | Discharge: 2020-06-09 | Disposition: A | Discharge status: Home / Self Care | Payer: Medicare Other | Attending: Emergency Medicine | Admitting: Emergency Medicine

## 2020-06-09 ENCOUNTER — Ambulatory Visit (INDEPENDENT_AMBULATORY_CARE_PROVIDER_SITE_OTHER): Payer: Medicare Other

## 2020-06-09 ENCOUNTER — Encounter (HOSPITAL_COMMUNITY): Payer: Self-pay

## 2020-06-09 DIAGNOSIS — R109 Unspecified abdominal pain: Secondary | ICD-10-CM | POA: Diagnosis not present

## 2020-06-09 DIAGNOSIS — R112 Nausea with vomiting, unspecified: Secondary | ICD-10-CM

## 2020-06-09 DIAGNOSIS — R1032 Left lower quadrant pain: Secondary | ICD-10-CM | POA: Diagnosis not present

## 2020-06-09 HISTORY — DX: Calculus of gallbladder without cholecystitis without obstruction: K80.20

## 2020-06-09 LAB — COMPREHENSIVE METABOLIC PANEL
ALT: 21 U/L (ref 0–44)
AST: 21 U/L (ref 15–41)
Albumin: 4 g/dL (ref 3.5–5.0)
Alkaline Phosphatase: 108 U/L (ref 38–126)
Anion gap: 8 (ref 5–15)
BUN: 14 mg/dL (ref 8–23)
CO2: 25 mmol/L (ref 22–32)
Calcium: 9.6 mg/dL (ref 8.9–10.3)
Chloride: 102 mmol/L (ref 98–111)
Creatinine, Ser: 1.01 mg/dL — ABNORMAL HIGH (ref 0.44–1.00)
GFR, Estimated: >60 mL/min (ref >60)
Glucose, Bld: 107 mg/dL — ABNORMAL HIGH (ref 70–99)
Potassium: 3.7 mmol/L (ref 3.5–5.1)
Sodium: 135 mmol/L (ref 135–145)
Total Bilirubin: 0.8 mg/dL (ref 0.3–1.2)
Total Protein: 8.2 g/dL — ABNORMAL HIGH (ref 6.5–8.1)

## 2020-06-09 LAB — CBC WITH DIFFERENTIAL/PLATELET
Abs Immature Granulocytes: 0.04 10*3/uL (ref 0.00–0.07)
Basophils Absolute: 0 10*3/uL (ref 0.0–0.1)
Basophils Relative: 0 %
Eosinophils Absolute: 0.2 10*3/uL (ref 0.0–0.5)
Eosinophils Relative: 2 %
HCT: 44.2 % (ref 36.0–46.0)
Hemoglobin: 14.9 g/dL (ref 12.0–15.0)
Immature Granulocytes: 0 %
Lymphocytes Relative: 27 %
Lymphs Abs: 3.1 10*3/uL (ref 0.7–4.0)
MCH: 31.8 pg (ref 26.0–34.0)
MCHC: 33.7 g/dL (ref 30.0–36.0)
MCV: 94.4 fL (ref 80.0–100.0)
Monocytes Absolute: 0.8 10*3/uL (ref 0.1–1.0)
Monocytes Relative: 7 %
Neutro Abs: 7.3 10*3/uL (ref 1.7–7.7)
Neutrophils Relative %: 64 %
Platelets: 342 10*3/uL (ref 150–400)
RBC: 4.68 MIL/uL (ref 3.87–5.11)
RDW: 12.5 % (ref 11.5–15.5)
WBC: 11.5 10*3/uL — ABNORMAL HIGH (ref 4.0–10.5)
nRBC: 0 % (ref 0.0–0.2)

## 2020-06-09 LAB — POCT URINALYSIS DIPSTICK, ED / UC
Bilirubin Urine: NEGATIVE
Glucose, UA: NEGATIVE mg/dL
Ketones, ur: NEGATIVE mg/dL
Leukocytes,Ua: NEGATIVE
Nitrite: NEGATIVE
Protein, ur: NEGATIVE mg/dL
Specific Gravity, Urine: 1.02 (ref 1.005–1.030)
Urobilinogen, UA: 0.2 mg/dL (ref 0.0–1.0)
pH: 6 (ref 5.0–8.0)

## 2020-06-09 LAB — LIPASE, BLOOD: Lipase: 36 U/L (ref 11–51)

## 2020-06-09 NOTE — ED Triage Notes (Signed)
Pt in with c/o gallbladder pain, N/V that started 2 weeks ago. States last night she was vomiting   She was seen 2 weeks ago and had an MRI and was told she has gall stones and has to see a Careers adviser at the end of the month

## 2020-06-09 NOTE — Discharge Instructions (Signed)
I will contact you this afternoon about your lab test results.

## 2020-06-09 NOTE — ED Provider Notes (Signed)
[facility]  ____________________________________________  Time seen: Approximately 11:05 AM  I have reviewed the triage vital signs and the nursing notes.   HISTORY  Chief Complaint Gallstones, Nausea, and Emesis   Historian Patient    HPI Natasha Hunter is a 65 y.o. female presents to the urgent care with left lower quadrant abdominal pain started last night.  Patient states that she was recently diagnosed with cholelithiasis and is curious as to whether or not her recent symptoms are associated.  Patient states that she picked up her son from work and states that she had lower abdominal discomfort and then emesis.  Patient states that she woke up this morning and her abdominal pain was gone.  She denies current nausea or vomiting.  She has been afebrile.  She states that her pain is not changed with eating or changes in position.  Patient has not had a bowel movement today but states that she has been able to pass gas.   Past Medical History:  Diagnosis Date  . Arthritis   . Asthma    INFFREQUENT PROBLEMS - DOES NOT HAVE INHALER  . Complication of anesthesia   . Depression   . Difficulty sleeping   . Gall stones   . Genital herpes   . History of transfusion   . Hypertension   . PONV (postoperative nausea and vomiting)      Immunizations up to date:  Yes.     Past Medical History:  Diagnosis Date  . Arthritis   . Asthma    INFFREQUENT PROBLEMS - DOES NOT HAVE INHALER  . Complication of anesthesia   . Depression   . Difficulty sleeping   . Gall stones   . Genital herpes   . History of transfusion   . Hypertension   . PONV (postoperative nausea and vomiting)     Patient Active Problem List   Diagnosis Date Noted  . S/P left TKA 05/17/2014  . S/P knee replacement 05/17/2014    Past Surgical History:  Procedure Laterality Date  . ABDOMINAL HYSTERECTOMY  2001  . ABORATIONS     X2  . JOINT REPLACEMENT  2007   RT KNEE  . KNEE ARTHROSCOPY   2005   rt knee  . PARTIAL HIP ARTHROPLASTY Right   . TOTAL KNEE ARTHROPLASTY Left 05/17/2014   Procedure: LEFT TOTAL KNEE ARTHROPLASTY;  Surgeon: Shelda PalMatthew D Olin, MD;  Location: [facility];  Service: Orthopedics;  Laterality: Left;    Prior to Admission medications   Medication Sig Start Date End Date Taking? Authorizing Provider  rosuvastatin (CRESTOR) 10 MG tablet Take 10 mg by mouth daily.   Yes [provider]  amitriptyline (ELAVIL) 25 MG tablet Take 25 mg by mouth at bedtime. 09/12/17   [provider]  atenolol (TENORMIN) 50 MG tablet Take 50 mg by mouth 2 (two) times daily.     [provider]  cephALEXin (KEFLEX) 500 MG capsule Take 1 capsule (500 mg total) by mouth 2 (two) times daily. 12/09/17   Law, Waylan BogaAlexandra M, PA-C  cloNIDine (CATAPRES) 0.2 MG tablet Take 0.2 mg by mouth 2 (two) times daily.    [provider]  docusate sodium (COLACE) 100 MG capsule Take 1 capsule (100 mg total) by mouth 2 (two) times daily. Patient not taking: Reported on 12/09/2017 05/18/14   Lanney GinsBabish, Matthew, PA-C  ferrous sulfate 325 (65 FE) MG tablet Take 1 tablet (325 mg total) by mouth 3 (three) times daily after meals. Patient  not taking: Reported on 12/09/2017 05/18/14   Lanney Gins, PA-C  HYDROcodone-acetaminophen (NORCO) 7.5-325 MG per tablet Take 1-2 tablets by mouth every 4 (four) hours as needed for moderate pain. 05/18/14   Lanney Gins, PA-C  ibuprofen (ADVIL,MOTRIN) 800 MG tablet Take 800 mg by mouth 3 (three) times daily as needed for moderate pain.  09/26/17   [provider]  lisinopril-hydrochlorothiazide (PRINZIDE,ZESTORETIC) 20-25 MG per tablet Take 1 tablet by mouth daily.     [provider]  methocarbamol (ROBAXIN) 500 MG tablet Take 1 tablet (500 mg total) by mouth every 6 (six) hours as needed for muscle spasms. 05/18/14   Lanney Gins, PA-C  ondansetron (ZOFRAN ODT) 4 MG disintegrating tablet Take 1 tablet (4 mg total) by mouth every 8  (eight) hours as needed for nausea or vomiting. 12/09/17   Harlene Salts A, PA-C  polyethylene glycol (MIRALAX / GLYCOLAX) packet Take 17 g by mouth 2 (two) times daily. Patient not taking: Reported on 12/09/2017 05/18/14   Lanney Gins, PA-C  predniSONE (DELTASONE) 20 MG tablet Take 3 tablets (60 mg total) by mouth daily. 12/09/17   Law, Waylan Boga, PA-C  traZODone (DESYREL) 50 MG tablet Take 50-150 mg by mouth at bedtime as needed for sleep.    [provider]    Allergies Darvon [propoxyphene], Sulfa antibiotics, and Other  Family History  Problem Relation Age of Onset  . Diabetes Mother   . High Cholesterol Mother   . Kidney Stones Mother     Social History Social History   Tobacco Use  . Smoking status: Never Smoker  . Smokeless tobacco: Never Used  Vaping Use  . Vaping Use: Never used  Substance Use Topics  . Alcohol use: No  . Drug use: No     Review of Systems  Constitutional: No fever/chills Eyes:  No discharge ENT: No upper respiratory complaints. Respiratory: no cough. No SOB/ use of accessory muscles to breath Gastrointestinal: Patient has left lower quadrant abdominal pain.  Musculoskeletal: Negative for musculoskeletal pain. Skin: Negative for rash, abrasions, lacerations, ecchymosis.    ____________________________________________   PHYSICAL EXAM:  VITAL SIGNS: ED Triage Vitals  Enc Vitals Group     BP 06/09/20 1016 130/87     Pulse Rate 06/09/20 1016 80     Resp 06/09/20 1016 20     Temp --      Temp src --      SpO2 06/09/20 1016 98 %     Weight --      Height --      Head Circumference --      Peak Flow --      Pain Score 06/09/20 1010 0     Pain Loc --      Pain Edu? --      Excl. in GC? --      Constitutional: Alert and oriented. Well appearing and in no acute distress. Eyes: Conjunctivae are normal. PERRL. EOMI. Head: Atraumatic. ENT:      Nose: No congestion/rhinnorhea.      Mouth/Throat: Mucous membranes are  moist.  Neck: No stridor.  No cervical spine tenderness to palpation. Hematological/Lymphatic/Immunilogical: No cervical lymphadenopathy. Cardiovascular: Normal rate, regular rhythm. Normal S1 and S2.  Good peripheral circulation. Respiratory: Normal respiratory effort without tachypnea or retractions. Lungs CTAB. Good air entry to the bases with no decreased or absent breath sounds Gastrointestinal: Bowel sounds x 4 quadrants. Soft and nontender to palpation. No guarding or rigidity. No distention. Musculoskeletal: Full range  of motion to all extremities. No obvious deformities noted Neurologic:  Normal for age. No gross focal neurologic deficits are appreciated.  Skin:  Skin is warm, dry and intact. No rash noted. Psychiatric: Mood and affect are normal for age. Speech and behavior are normal.   ____________________________________________   LABS (all labs ordered are listed, but only abnormal results are displayed)  Labs Reviewed  POCT URINALYSIS DIPSTICK, ED / UC - Abnormal; Notable for the following components:      Result Value   Hgb urine dipstick LARGE (*)    All other components within normal limits  URINE CULTURE  CBC WITH DIFFERENTIAL/PLATELET  COMPREHENSIVE METABOLIC PANEL  LIPASE, BLOOD   ____________________________________________  EKG   ____________________________________________  RADIOLOGY Geraldo Pitter, personally viewed and evaluated these images (plain radiographs) as part of my medical decision making, as well as reviewing the written report by the radiologist.    DG Abdomen 1 View  Result Date: 06/09/2020 CLINICAL DATA:  Abdominal pain with nausea and vomiting EXAM: ABDOMEN - 1 VIEW COMPARISON:  CT abdomen and pelvis April 20, 2020. FINDINGS: There is moderate stool in the colon. There is no bowel dilatation or air-fluid level to suggest bowel obstruction. No free air. Cholelithiasis noted in the right upper quadrant. Total hip replacements noted  bilaterally. Apparent phleboliths in the pelvis. IMPRESSION: Cholelithiasis. Moderate stool in colon. No findings indicative of bowel obstruction or free air on supine examination. Electronically Signed   By: Bretta Bang III M.D.   On: 06/09/2020 11:28    ____________________________________________    PROCEDURES  Procedure(s) performed:     Procedures     Medications - No data to display   ____________________________________________   INITIAL IMPRESSION / ASSESSMENT AND PLAN / ED COURSE  Pertinent labs & imaging results that were available during my care of the patient were reviewed by me and considered in my medical decision making (see chart for details).      Assessment and Plan: Nausea:  Left lower quadrant abdominal pain:  65 year old female presents to the urgent care with left lower quadrant abdominal discomfort.  Vital signs were reassuring at triage.  On physical exam, patient was alert, active and nontoxic-appearing without tenderness elicited to palpation.  Differential diagnosis at this time includes diverticulitis, constipation, bowel obstruction...  Patient states that she had CT abdomen and pelvis and MRI abdomen and pelvis within the past month.  MRI results were reviewed which showed no signs of common bile duct obstruction and findings suggestive of cholelithiasis without cholecystitis.  We order basic labs.  KUB showed no signs of obstruction.  Patient states that she is currently completely asymptomatic.  Patient was advised that if her lab results were concerning, she would need to seek care at local emergency department for further care and management.    Attempted to contact patient by phone to notify her of lab results.  CBC indicated mild leukocytosis but was otherwise reassuring.  CMP was largely within reference range.  Lipase was within reference range.  Urine culture is pending.  As long as patient continues to be pain-free, I feel she  can wait for her general surgery appointment at the end of the month.  Patient will be instructed to seek care at the emergency department if her pain returns for further imaging.   ____________________________________________  FINAL CLINICAL IMPRESSION(S) / ED DIAGNOSES  Final diagnoses:  Left lower quadrant abdominal pain      NEW MEDICATIONS STARTED DURING THIS VISIT:  ED Discharge Orders    None          This chart was dictated using voice recognition software/Dragon. Despite best efforts to proofread, errors can occur which can change the meaning. Any change was purely unintentional.     Orvil Feil, PA-C 06/09/20 1435

## 2020-06-10 DIAGNOSIS — H43811 Vitreous degeneration, right eye: Secondary | ICD-10-CM | POA: Diagnosis not present

## 2020-06-10 LAB — URINE CULTURE: Culture: <10000 — AB

## 2020-06-13 IMAGING — CR DG HIP (WITH OR WITHOUT PELVIS) 2-3V RIGHT
3 series · 3 of 3 positions shown · non-contrast
Comparison: Right hip radiograph dated 07/23/2018

CLINICAL DATA: 63-year-old female with right hip pain.

EXAM:
DG HIP (WITH OR WITHOUT PELVIS) 2-3V RIGHT

[x pelvis]
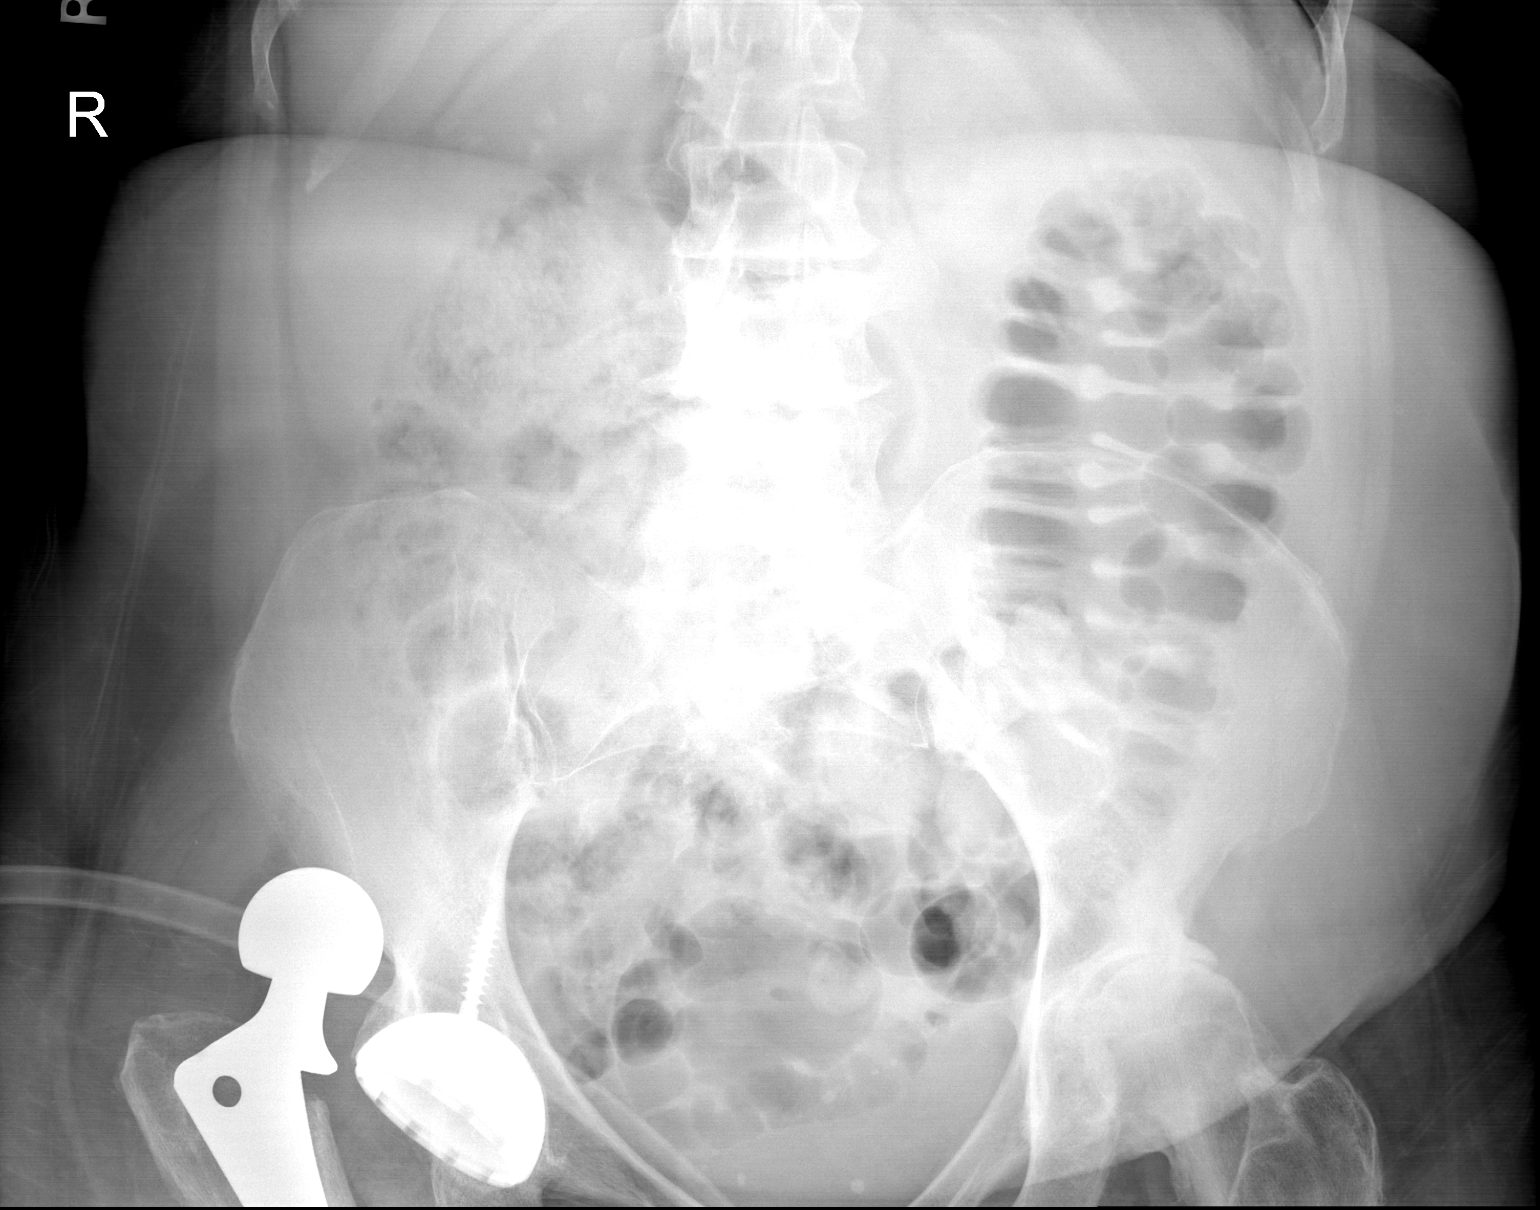

[x hip ap right]
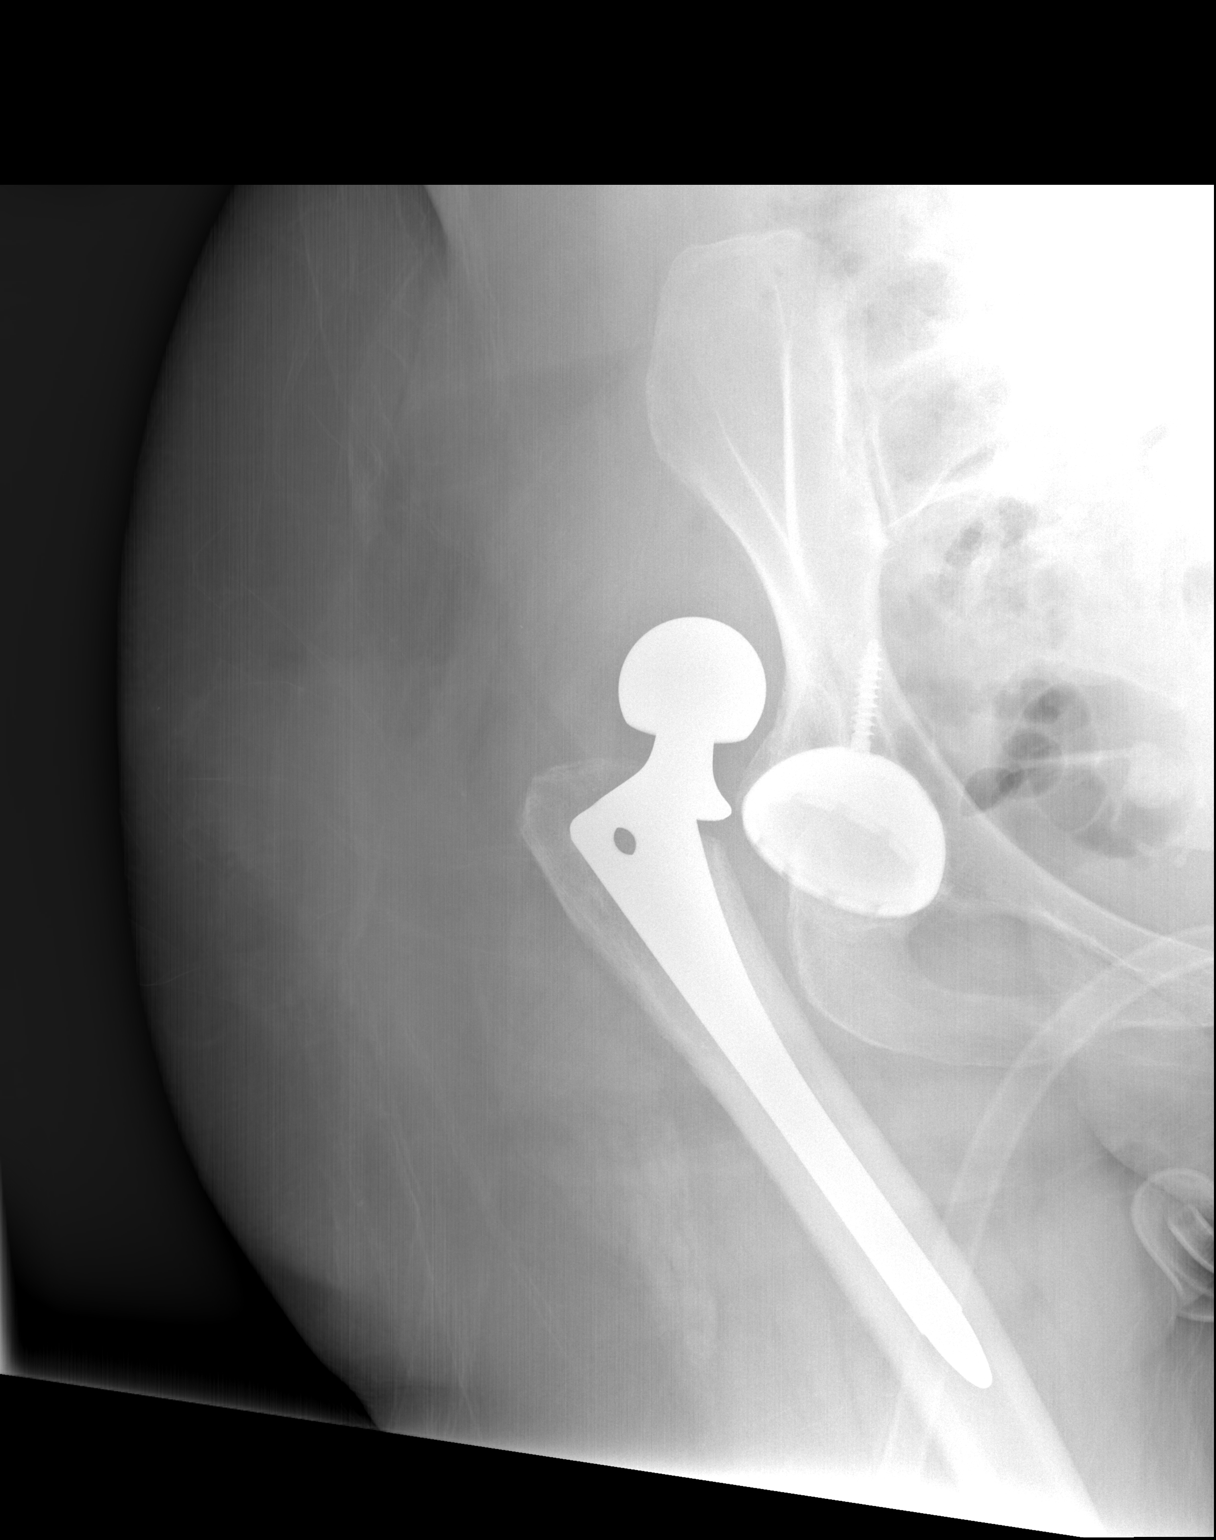

[w hip lat right]
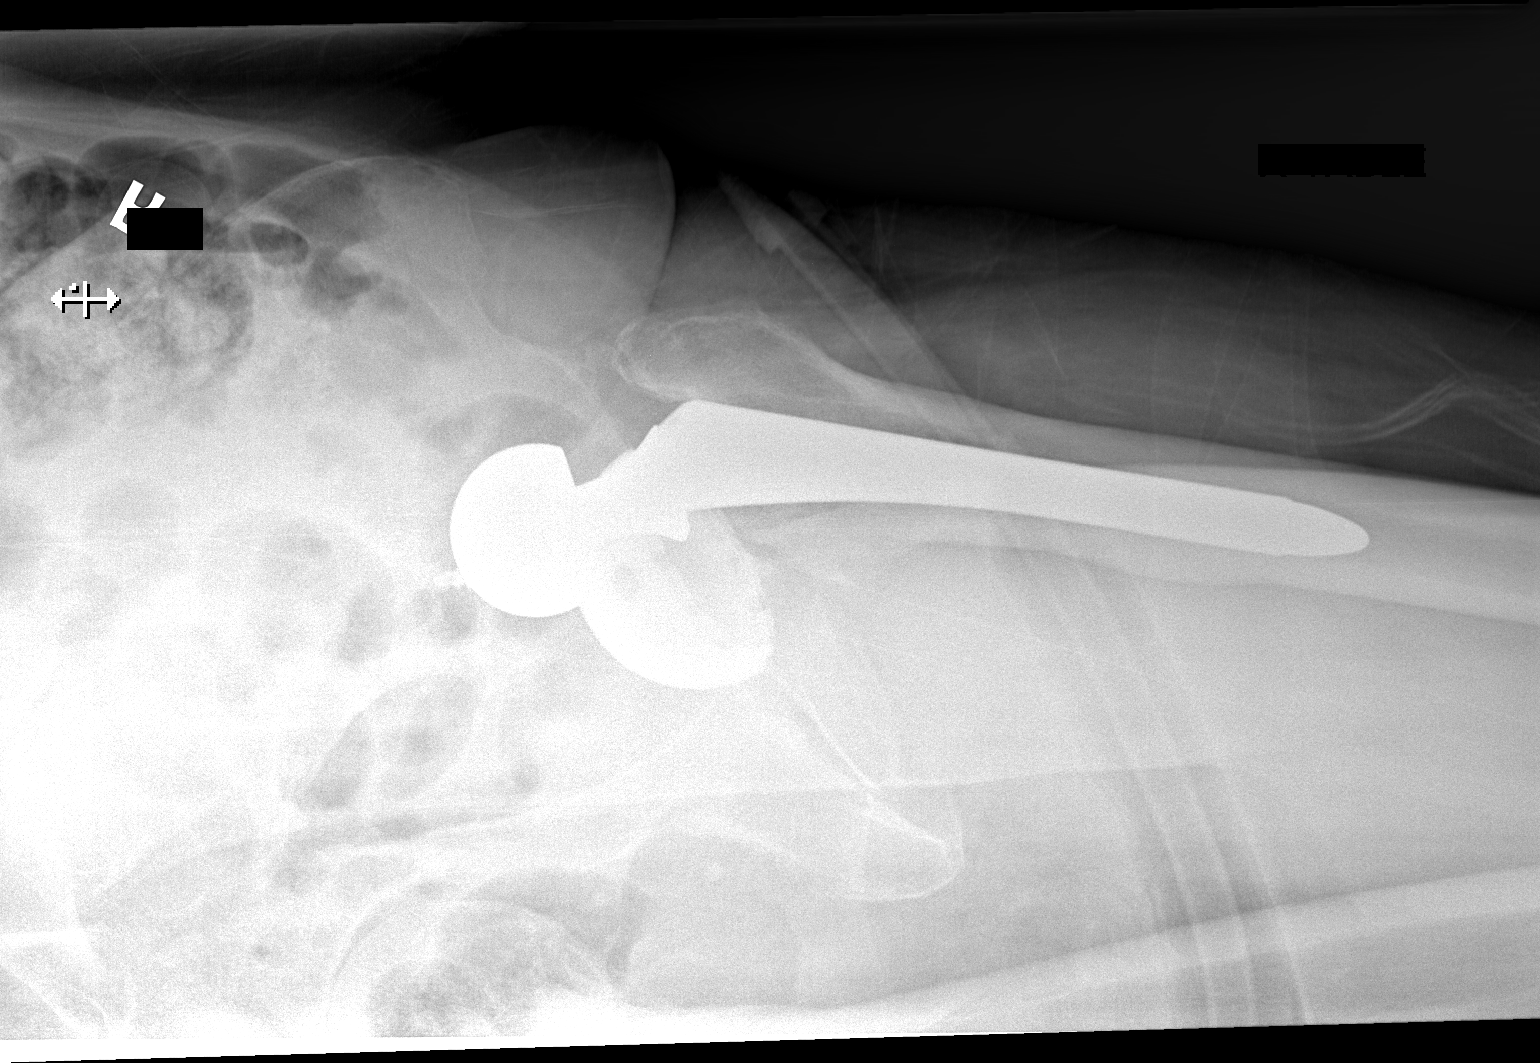

[3 of 3 positions shown; findings below may reference images not displayed]

FINDINGS: There is a total right hip arthroplasty. There is superior
dislocation of the femoral component in relation to the acetabular
cup. There is no acute fracture. Advanced arthritic changes of the
left hip. Degenerative changes of the lumbar spine. Several
radiopaque densities in the right upper quadrant may represent
gallstones versus renal calculi. There is moderate stool throughout
the colon.
IMPRESSION: Superior dislocation of the right hip arthroplasty.

## 2020-06-13 IMAGING — DX DG HIP (WITH OR WITHOUT PELVIS) 2-3V RIGHT
2 series · 2 of 2 positions shown · non-contrast
Comparison: 09/26/2018

CLINICAL DATA: Post reduction

EXAM:
DG HIP (WITH OR WITHOUT PELVIS) 2-3V RIGHT

[pelvis ap]
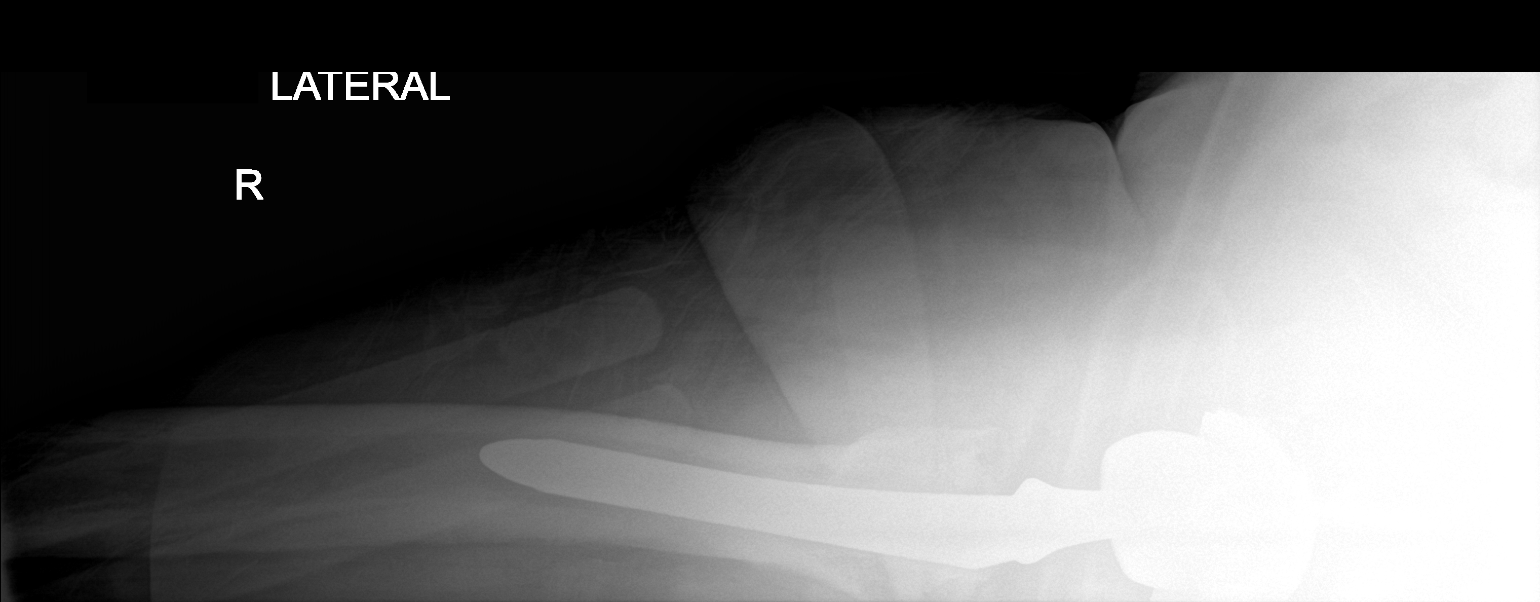

[hip ap]
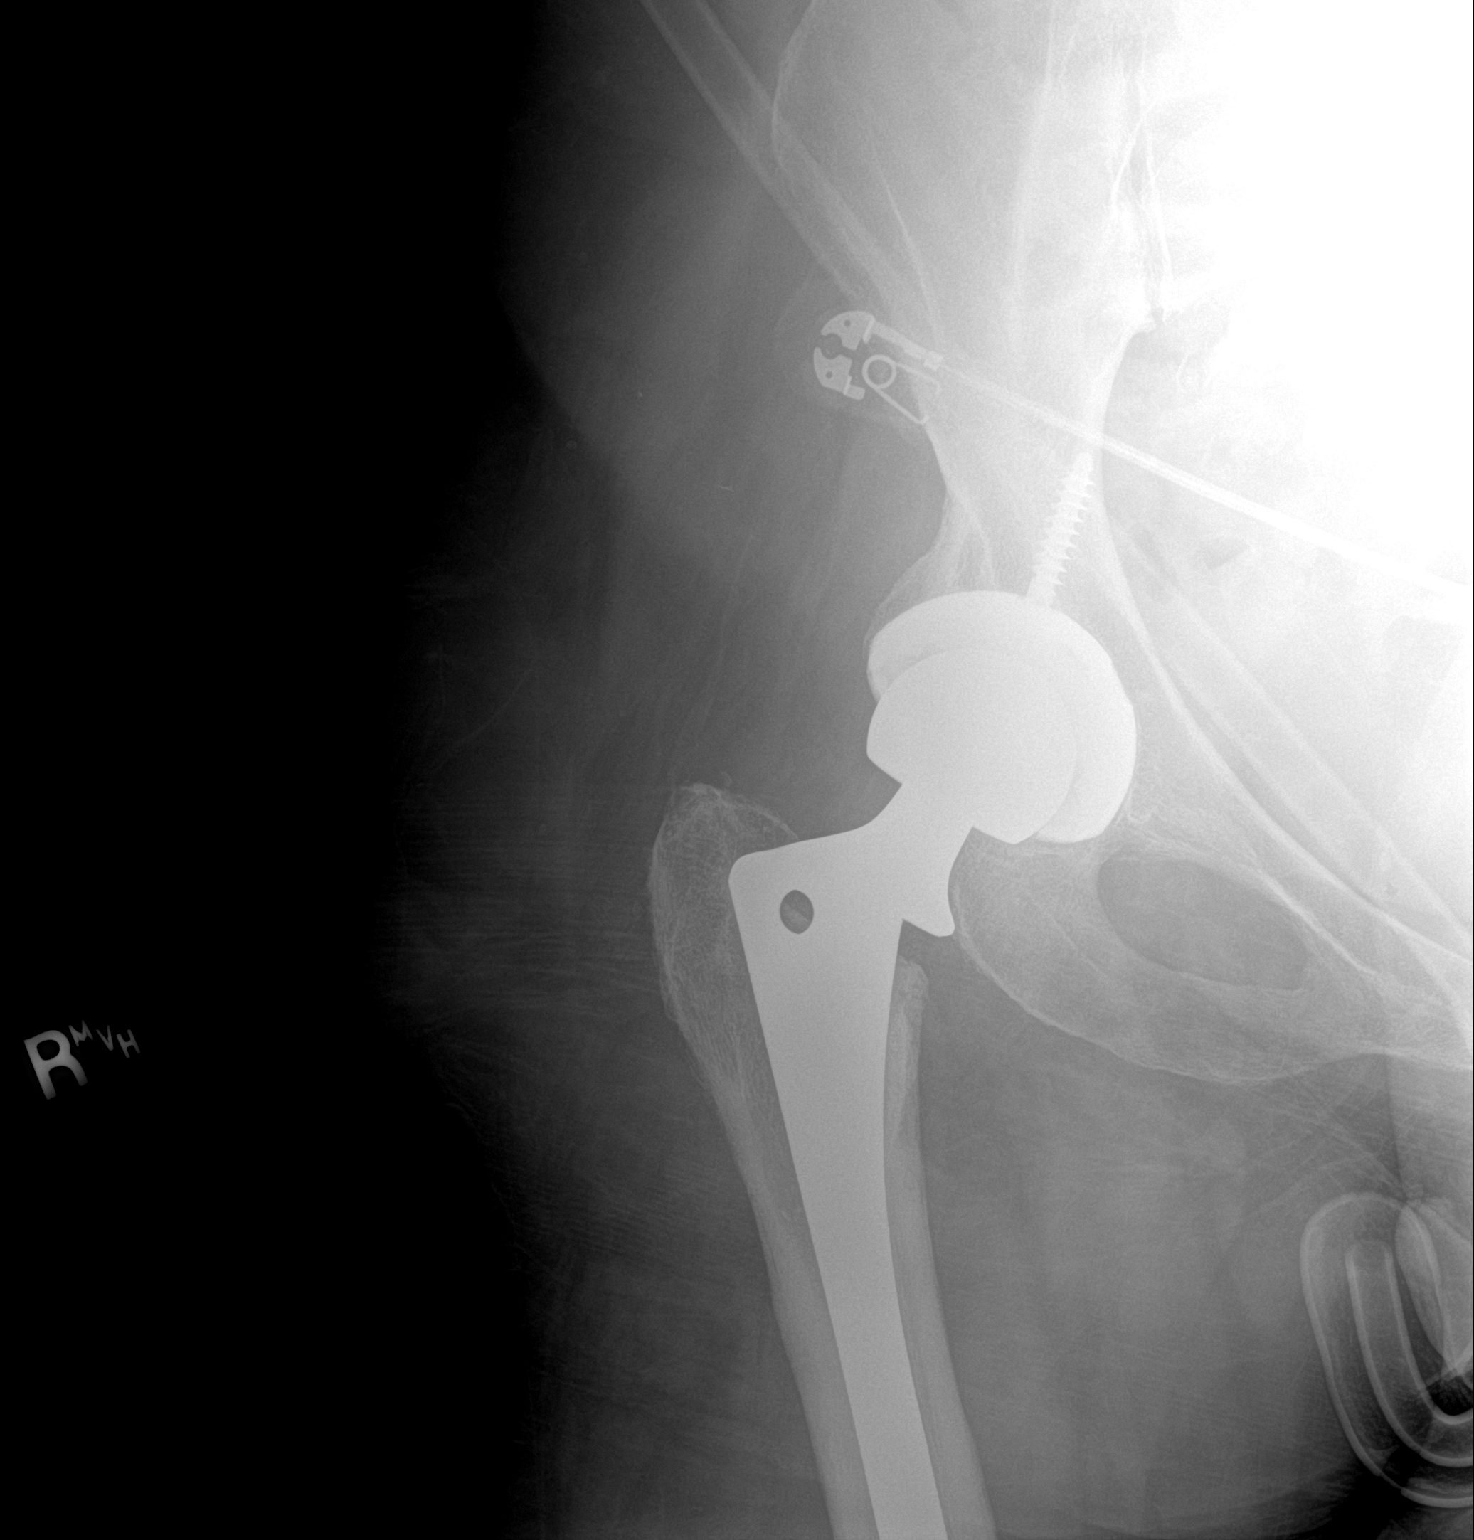

[2 of 2 positions shown; findings below may reference images not displayed]

FINDINGS: Status post right hip replacement. Reduction of previously noted
dislocated right femoral component. Alignment within normal limits.
No fracture.
IMPRESSION: Interval reduction of dislocated right femoral component, now with
normal alignment.

## 2020-06-22 DIAGNOSIS — E782 Mixed hyperlipidemia: Secondary | ICD-10-CM | POA: Diagnosis not present

## 2020-06-22 DIAGNOSIS — Z23 Encounter for immunization: Secondary | ICD-10-CM | POA: Diagnosis not present

## 2020-06-22 DIAGNOSIS — I10 Essential (primary) hypertension: Secondary | ICD-10-CM | POA: Diagnosis not present

## 2020-06-22 DIAGNOSIS — N3281 Overactive bladder: Secondary | ICD-10-CM | POA: Diagnosis not present

## 2020-06-22 DIAGNOSIS — Z7185 Encounter for immunization safety counseling: Secondary | ICD-10-CM | POA: Diagnosis not present

## 2020-06-22 DIAGNOSIS — Z0000 Encounter for general adult medical examination without abnormal findings: Secondary | ICD-10-CM | POA: Diagnosis not present

## 2020-06-22 DIAGNOSIS — G479 Sleep disorder, unspecified: Secondary | ICD-10-CM | POA: Diagnosis not present

## 2020-06-22 DIAGNOSIS — J4520 Mild intermittent asthma, uncomplicated: Secondary | ICD-10-CM | POA: Diagnosis not present

## 2020-06-23 ENCOUNTER — Institutional Professional Consult (permissible substitution): Payer: Medicare Other | Admitting: Internal Medicine

## 2020-06-30 DIAGNOSIS — K8050 Calculus of bile duct without cholangitis or cholecystitis without obstruction: Secondary | ICD-10-CM | POA: Diagnosis not present

## 2020-07-20 ENCOUNTER — Institutional Professional Consult (permissible substitution): Payer: Medicare Other | Admitting: Internal Medicine

## 2020-08-02 ENCOUNTER — Inpatient Hospital Stay: Admission: RE | Admit: 2020-08-02 | Payer: Self-pay | Source: Ambulatory Visit

## 2020-08-02 ENCOUNTER — Other Ambulatory Visit: Payer: Self-pay | Admitting: Family Medicine

## 2020-08-02 DIAGNOSIS — E2839 Other primary ovarian failure: Secondary | ICD-10-CM

## 2020-08-04 ENCOUNTER — Ambulatory Visit
Admission: RE | Admit: 2020-08-04 | Discharge: 2020-08-04 | Disposition: A | Discharge status: Home / Self Care | Payer: Medicare Other | Source: Ambulatory Visit | Attending: Family Medicine | Admitting: Family Medicine

## 2020-08-04 ENCOUNTER — Other Ambulatory Visit: Payer: Self-pay

## 2020-08-04 DIAGNOSIS — Z1231 Encounter for screening mammogram for malignant neoplasm of breast: Secondary | ICD-10-CM

## 2020-08-05 ENCOUNTER — Other Ambulatory Visit: Payer: Self-pay | Admitting: Family Medicine

## 2020-08-05 DIAGNOSIS — R928 Other abnormal and inconclusive findings on diagnostic imaging of breast: Secondary | ICD-10-CM

## 2020-08-09 DIAGNOSIS — R350 Frequency of micturition: Secondary | ICD-10-CM | POA: Diagnosis not present

## 2020-08-09 DIAGNOSIS — M6281 Muscle weakness (generalized): Secondary | ICD-10-CM | POA: Diagnosis not present

## 2020-08-10 ENCOUNTER — Institutional Professional Consult (permissible substitution): Payer: Self-pay | Admitting: Internal Medicine

## 2020-08-10 DIAGNOSIS — K8020 Calculus of gallbladder without cholecystitis without obstruction: Secondary | ICD-10-CM | POA: Insufficient documentation

## 2020-08-10 DIAGNOSIS — J4520 Mild intermittent asthma, uncomplicated: Secondary | ICD-10-CM | POA: Insufficient documentation

## 2020-08-10 DIAGNOSIS — E781 Pure hyperglyceridemia: Secondary | ICD-10-CM | POA: Insufficient documentation

## 2020-08-10 DIAGNOSIS — I700 Atherosclerosis of aorta: Secondary | ICD-10-CM | POA: Insufficient documentation

## 2020-08-10 DIAGNOSIS — F419 Anxiety disorder, unspecified: Secondary | ICD-10-CM | POA: Insufficient documentation

## 2020-08-10 DIAGNOSIS — H2513 Age-related nuclear cataract, bilateral: Secondary | ICD-10-CM | POA: Diagnosis not present

## 2020-08-10 DIAGNOSIS — E669 Obesity, unspecified: Secondary | ICD-10-CM | POA: Insufficient documentation

## 2020-08-10 DIAGNOSIS — A6004 Herpesviral vulvovaginitis: Secondary | ICD-10-CM | POA: Insufficient documentation

## 2020-08-10 DIAGNOSIS — G4700 Insomnia, unspecified: Secondary | ICD-10-CM | POA: Insufficient documentation

## 2020-08-10 DIAGNOSIS — G479 Sleep disorder, unspecified: Secondary | ICD-10-CM | POA: Insufficient documentation

## 2020-08-10 DIAGNOSIS — N950 Postmenopausal bleeding: Secondary | ICD-10-CM | POA: Insufficient documentation

## 2020-08-10 DIAGNOSIS — R932 Abnormal findings on diagnostic imaging of liver and biliary tract: Secondary | ICD-10-CM | POA: Insufficient documentation

## 2020-08-10 DIAGNOSIS — J45909 Unspecified asthma, uncomplicated: Secondary | ICD-10-CM | POA: Insufficient documentation

## 2020-08-10 DIAGNOSIS — N952 Postmenopausal atrophic vaginitis: Secondary | ICD-10-CM | POA: Insufficient documentation

## 2020-08-10 DIAGNOSIS — N3281 Overactive bladder: Secondary | ICD-10-CM | POA: Insufficient documentation

## 2020-08-10 DIAGNOSIS — K5901 Slow transit constipation: Secondary | ICD-10-CM | POA: Insufficient documentation

## 2020-08-10 DIAGNOSIS — K838 Other specified diseases of biliary tract: Secondary | ICD-10-CM | POA: Insufficient documentation

## 2020-08-10 DIAGNOSIS — I872 Venous insufficiency (chronic) (peripheral): Secondary | ICD-10-CM | POA: Insufficient documentation

## 2020-08-10 DIAGNOSIS — A6000 Herpesviral infection of urogenital system, unspecified: Secondary | ICD-10-CM | POA: Insufficient documentation

## 2020-08-10 DIAGNOSIS — E2839 Other primary ovarian failure: Secondary | ICD-10-CM | POA: Insufficient documentation

## 2020-08-10 DIAGNOSIS — H43811 Vitreous degeneration, right eye: Secondary | ICD-10-CM | POA: Diagnosis not present

## 2020-08-12 ENCOUNTER — Ambulatory Visit: Payer: Self-pay | Admitting: General Surgery

## 2020-08-25 ENCOUNTER — Ambulatory Visit
Admission: RE | Admit: 2020-08-25 | Discharge: 2020-08-25 | Disposition: A | Discharge status: Home / Self Care | Payer: Medicare Other | Source: Ambulatory Visit | Attending: Family Medicine | Admitting: Family Medicine

## 2020-08-25 ENCOUNTER — Other Ambulatory Visit: Payer: Self-pay

## 2020-08-25 DIAGNOSIS — Z780 Asymptomatic menopausal state: Secondary | ICD-10-CM | POA: Diagnosis not present

## 2020-08-25 DIAGNOSIS — E2839 Other primary ovarian failure: Secondary | ICD-10-CM

## 2020-08-26 ENCOUNTER — Ambulatory Visit: Payer: Medicare Other

## 2020-08-26 ENCOUNTER — Other Ambulatory Visit: Payer: Medicare Other

## 2020-08-26 ENCOUNTER — Ambulatory Visit
Admission: RE | Admit: 2020-08-26 | Discharge: 2020-08-26 | Disposition: A | Discharge status: Home / Self Care | Payer: Medicare Other | Source: Ambulatory Visit | Attending: Family Medicine | Admitting: Family Medicine

## 2020-08-26 ENCOUNTER — Other Ambulatory Visit: Payer: Self-pay

## 2020-08-26 DIAGNOSIS — R922 Inconclusive mammogram: Secondary | ICD-10-CM | POA: Diagnosis not present

## 2020-08-26 DIAGNOSIS — R928 Other abnormal and inconclusive findings on diagnostic imaging of breast: Secondary | ICD-10-CM | POA: Diagnosis not present

## 2020-08-26 DIAGNOSIS — N6489 Other specified disorders of breast: Secondary | ICD-10-CM | POA: Diagnosis not present

## 2020-08-31 NOTE — Patient Instructions (Signed)
DUE TO COVID-19 ONLY ONE VISITOR IS ALLOWED TO COME WITH YOU AND STAY IN THE WAITING ROOM ONLY DURING PRE OP AND PROCEDURE DAY OF SURGERY. THE 1 VISITOR  MAY VISIT WITH YOU AFTER SURGERY IN YOUR PRIVATE ROOM DURING VISITING HOURS ONLY!                Natasha Hunter   Your procedure is scheduled on: 09/08/20   Report to [facility]  Entrance   Report to admitting at   7:30 AM     Call this number if you have problems the morning of surgery [phone]    BRUSH YOUR TEETH MORNING OF SURGERY AND RINSE YOUR MOUTH OUT, NO CHEWING GUM CANDY OR MINTS.   No food after midnight.    You may have clear liquid until 6:30 AM.    At 6:00 AM drink pre surgery drink.   Nothing by mouth after 6:30 AM.   Take these medicines the morning of surgery with A SIP OF WATER: Atenolol, Acyclovir                                You may not have any metal on your body including hair pins and              piercings  Do not wear jewelry, make-up, lotions, powders or perfumes, deodorant             Do not wear nail polish on your fingernails.  Do not shave  48 hours prior to surgery.     Do not bring valuables to the hospital. [facility] IS NOT             RESPONSIBLE   FOR VALUABLES.  Contacts, dentures or bridgework may not be worn into surgery.      Patients discharged the day of surgery will not be allowed to drive home.  IF YOU ARE HAVING SURGERY AND GOING HOME THE SAME DAY, YOU MUST HAVE AN ADULT TO DRIVE YOU HOME AND BE WITH YOU FOR 24 HOURS. YOU MAY GO HOME BY TAXI OR UBER OR ORTHERWISE, BUT AN ADULT MUST ACCOMPANY YOU HOME AND STAY WITH YOU FOR 24 HOURS.  Name and phone number of your driver:  Special Instructions: N/A              Please read over the following fact sheets you were given: _____________________________________________________________________             [facility] - Preparing for Surgery Before surgery, you can play an important role.  Because skin is not  sterile, your skin needs to be as free of germs as possible.  You can reduce the number of germs on your skin by washing with CHG (chlorahexidine gluconate) soap before surgery.  CHG is an antiseptic cleaner which kills germs and bonds with the skin to continue killing germs even after washing. Please DO NOT use if you have an allergy to CHG or antibacterial soaps.  If your skin becomes reddened/irritated stop using the CHG and inform your nurse when you arrive at Short Stay. Do not shave (including legs and underarms) for at least 48 hours prior to the first CHG shower.Please follow these instructions carefully:   1.  Shower with CHG Soap the night before surgery and the  morning of Surgery.  2.  If you choose to wash your hair, wash your hair first as  usual with your  normal  shampoo.  3.  After you shampoo, rinse your hair and body thoroughly to remove the  shampoo.                                        4.  Use CHG as you would any other liquid soap.  You can apply chg directly  to the skin and wash                       Gently with a scrungie or clean washcloth.  5.  Apply the CHG Soap to your body ONLY FROM THE NECK DOWN.   Do not use on face/ open                           Wound or open sores. Avoid contact with eyes, ears mouth and genitals (private parts).                       Wash face,  Genitals (private parts) with your normal soap.             6.  Wash thoroughly, paying special attention to the area where your surgery  will be performed.  7.  Thoroughly rinse your body with warm water from the neck down.  8.  DO NOT shower/wash with your normal soap after using and rinsing off  the CHG Soap.             9.  Pat yourself dry with a clean towel.            10.  Wear clean pajamas.            11.  Place clean sheets on your bed the night of your first shower and do not  sleep with pets. Day of Surgery : Do not apply any lotions/deodorants the morning of surgery.  Please wear clean  clothes to the hospital/surgery center.  FAILURE TO FOLLOW THESE INSTRUCTIONS MAY RESULT IN THE CANCELLATION OF YOUR SURGERY PATIENT SIGNATURE_________________________________  NURSE SIGNATURE__________________________________  ________________________________________________________________________

## 2020-09-01 ENCOUNTER — Encounter (HOSPITAL_COMMUNITY)
Admission: RE | Admit: 2020-09-01 | Discharge: 2020-09-01 | Disposition: A | Discharge status: Home / Self Care | Payer: Medicare Other | Source: Ambulatory Visit | Attending: General Surgery | Admitting: General Surgery

## 2020-09-01 ENCOUNTER — Encounter (HOSPITAL_COMMUNITY): Payer: Self-pay

## 2020-09-01 ENCOUNTER — Other Ambulatory Visit: Payer: Self-pay

## 2020-09-01 DIAGNOSIS — Z01818 Encounter for other preprocedural examination: Secondary | ICD-10-CM | POA: Insufficient documentation

## 2020-09-01 LAB — CBC
HCT: 41.4 % (ref 36.0–46.0)
Hemoglobin: 13.7 g/dL (ref 12.0–15.0)
MCH: 31.6 pg (ref 26.0–34.0)
MCHC: 33.1 g/dL (ref 30.0–36.0)
MCV: 95.6 fL (ref 80.0–100.0)
Platelets: 271 10*3/uL (ref 150–400)
RBC: 4.33 MIL/uL (ref 3.87–5.11)
RDW: 12.5 % (ref 11.5–15.5)
WBC: 8.9 10*3/uL (ref 4.0–10.5)
nRBC: 0 % (ref 0.0–0.2)

## 2020-09-01 NOTE — Progress Notes (Signed)
COVID Vaccine Completed:Yes Date COVID Vaccine completed:08/12/19-booster 03/23/21 COVID vaccine manufacturer: Pfizer      PCP - Dr. Milas Gain Cardiologist - none  Chest x-ray - no EKG - 09/01/20-chart, epic Stress Test - no ECHO - no Cardiac Cath - NA Pacemaker/ICD device last checked:NA  Sleep Study - no CPAP -   Fasting Blood Sugar - NAChecks Blood Sugar _____ times a day  Blood Thinner Instructions:ASA 81/Dr. Hagler Aspirin Instructions:none Last Dose:09/02/20  Anesthesia review:   Patient denies shortness of breath, fever, cough and chest pain at PAT appointment Pt has no SOB climbing stairs, doing housework or with ADLs  Patient verbalized understanding of instructions that were given to them at the PAT appointment. Patient was also instructed that they will need to review over the PAT instructions again at home before surgery.yes

## 2020-09-05 ENCOUNTER — Other Ambulatory Visit (HOSPITAL_COMMUNITY): Payer: Self-pay

## 2020-09-08 ENCOUNTER — Encounter (HOSPITAL_COMMUNITY): Payer: Self-pay | Admitting: General Surgery

## 2020-09-08 ENCOUNTER — Ambulatory Visit (HOSPITAL_COMMUNITY)
Admission: RE | Admit: 2020-09-08 | Discharge: 2020-09-08 | Disposition: A | Discharge status: Home / Self Care | Payer: Medicare Other | Source: Ambulatory Visit | Attending: General Surgery | Admitting: General Surgery

## 2020-09-08 ENCOUNTER — Ambulatory Visit (HOSPITAL_COMMUNITY): Payer: Medicare Other | Admitting: Registered Nurse

## 2020-09-08 ENCOUNTER — Ambulatory Visit (HOSPITAL_COMMUNITY): Payer: Medicare Other

## 2020-09-08 ENCOUNTER — Encounter (HOSPITAL_COMMUNITY)
Admission: RE | Disposition: A | Discharge status: Home / Self Care | Payer: Self-pay | Source: Ambulatory Visit | Attending: General Surgery

## 2020-09-08 DIAGNOSIS — K8010 Calculus of gallbladder with chronic cholecystitis without obstruction: Secondary | ICD-10-CM | POA: Diagnosis not present

## 2020-09-08 DIAGNOSIS — Z7982 Long term (current) use of aspirin: Secondary | ICD-10-CM | POA: Diagnosis not present

## 2020-09-08 DIAGNOSIS — Z79899 Other long term (current) drug therapy: Secondary | ICD-10-CM | POA: Diagnosis not present

## 2020-09-08 DIAGNOSIS — K8064 Calculus of gallbladder and bile duct with chronic cholecystitis without obstruction: Secondary | ICD-10-CM | POA: Diagnosis not present

## 2020-09-08 DIAGNOSIS — Z96652 Presence of left artificial knee joint: Secondary | ICD-10-CM | POA: Diagnosis not present

## 2020-09-08 DIAGNOSIS — Z91018 Allergy to other foods: Secondary | ICD-10-CM | POA: Diagnosis not present

## 2020-09-08 DIAGNOSIS — Z882 Allergy status to sulfonamides status: Secondary | ICD-10-CM | POA: Diagnosis not present

## 2020-09-08 DIAGNOSIS — E781 Pure hyperglyceridemia: Secondary | ICD-10-CM | POA: Diagnosis not present

## 2020-09-08 DIAGNOSIS — K8020 Calculus of gallbladder without cholecystitis without obstruction: Secondary | ICD-10-CM

## 2020-09-08 DIAGNOSIS — Z9049 Acquired absence of other specified parts of digestive tract: Secondary | ICD-10-CM | POA: Diagnosis not present

## 2020-09-08 DIAGNOSIS — I10 Essential (primary) hypertension: Secondary | ICD-10-CM | POA: Diagnosis not present

## 2020-09-08 HISTORY — PX: CHOLECYSTECTOMY: SHX55

## 2020-09-08 SURGERY — LAPAROSCOPIC CHOLECYSTECTOMY WITH INTRAOPERATIVE CHOLANGIOGRAM
Anesthesia: General | Site: Abdomen

## 2020-09-08 MED ORDER — 0.9 % SODIUM CHLORIDE (POUR BTL) OPTIME
TOPICAL | Status: DC | PRN
  Administered 2020-09-08: 1000 mL

## 2020-09-08 MED ORDER — OXYCODONE HCL 5 MG PO TABS: 5.0000 mg | ORAL_TABLET | Freq: Four times a day (QID) | ORAL | 0 refills | Status: DC | PRN

## 2020-09-08 MED ORDER — SUCCINYLCHOLINE CHLORIDE 200 MG/10ML IV SOSY: PREFILLED_SYRINGE | INTRAVENOUS | Status: DC | PRN

## 2020-09-08 MED ORDER — IBUPROFEN 800 MG PO TABS: 800.0000 mg | ORAL_TABLET | Freq: Three times a day (TID) | ORAL | 0 refills | Status: DC | PRN

## 2020-09-08 MED ORDER — MIDAZOLAM HCL 2 MG/2ML IJ SOLN
INTRAMUSCULAR | Status: AC
  Filled 2020-09-08: qty 2

## 2020-09-08 MED ORDER — ENSURE PRE-SURGERY PO LIQD: 296.0000 mL | Freq: Once | ORAL | Status: DC

## 2020-09-08 MED ORDER — ROCURONIUM BROMIDE 10 MG/ML (PF) SYRINGE
PREFILLED_SYRINGE | INTRAVENOUS | Status: DC | PRN
  Administered 2020-09-08: 60 mg via INTRAVENOUS
  Administered 2020-09-08: 10 mg via INTRAVENOUS

## 2020-09-08 MED ORDER — LACTATED RINGERS IV SOLN: INTRAVENOUS | Status: DC

## 2020-09-08 MED ORDER — MIDAZOLAM HCL 5 MG/5ML IJ SOLN
INTRAMUSCULAR | Status: DC | PRN
  Administered 2020-09-08: 2 mg via INTRAVENOUS

## 2020-09-08 MED ORDER — DROPERIDOL 2.5 MG/ML IJ SOLN
INTRAMUSCULAR | Status: DC | PRN
  Administered 2020-09-08: .625 mg via INTRAVENOUS

## 2020-09-08 MED ORDER — ACETAMINOPHEN 500 MG PO TABS
1000.0000 mg | ORAL_TABLET | ORAL | Status: AC
  Administered 2020-09-08: 1000 mg via ORAL
  Filled 2020-09-08: qty 2

## 2020-09-08 MED ORDER — LACTATED RINGERS IR SOLN
Status: DC | PRN
  Administered 2020-09-08: 1000 mL

## 2020-09-08 MED ORDER — LIDOCAINE 2% (20 MG/ML) 5 ML SYRINGE
INTRAMUSCULAR | Status: AC
  Filled 2020-09-08: qty 5

## 2020-09-08 MED ORDER — OXYCODONE HCL 5 MG PO TABS: 5.0000 mg | ORAL_TABLET | Freq: Once | ORAL | Status: DC | PRN

## 2020-09-08 MED ORDER — SUGAMMADEX SODIUM 200 MG/2ML IV SOLN
INTRAVENOUS | Status: DC | PRN
  Administered 2020-09-08: 180 mg via INTRAVENOUS

## 2020-09-08 MED ORDER — ONDANSETRON HCL 4 MG/2ML IJ SOLN
INTRAMUSCULAR | Status: DC | PRN
  Administered 2020-09-08: 4 mg via INTRAVENOUS

## 2020-09-08 MED ORDER — CEFAZOLIN SODIUM-DEXTROSE 2-4 GM/100ML-% IV SOLN
2.0000 g | INTRAVENOUS | Status: AC
  Administered 2020-09-08: 2 g via INTRAVENOUS
  Filled 2020-09-08: qty 100

## 2020-09-08 MED ORDER — ONDANSETRON HCL 4 MG/2ML IJ SOLN: 4.0000 mg | Freq: Once | INTRAMUSCULAR | Status: DC | PRN

## 2020-09-08 MED ORDER — ROCURONIUM BROMIDE 10 MG/ML (PF) SYRINGE
PREFILLED_SYRINGE | INTRAVENOUS | Status: AC
  Filled 2020-09-08: qty 10

## 2020-09-08 MED ORDER — PROPOFOL 10 MG/ML IV BOLUS
INTRAVENOUS | Status: AC
  Filled 2020-09-08: qty 20

## 2020-09-08 MED ORDER — IOHEXOL 300 MG/ML  SOLN
INTRAMUSCULAR | Status: DC | PRN
  Administered 2020-09-08: 14.5 mL

## 2020-09-08 MED ORDER — BUPIVACAINE-EPINEPHRINE 0.25% -1:200000 IJ SOLN
INTRAMUSCULAR | Status: DC | PRN
  Administered 2020-09-08: 30 mL

## 2020-09-08 MED ORDER — DEXAMETHASONE SODIUM PHOSPHATE 10 MG/ML IJ SOLN
INTRAMUSCULAR | Status: DC | PRN
  Administered 2020-09-08: 10 mg via INTRAVENOUS

## 2020-09-08 MED ORDER — CHLORHEXIDINE GLUCONATE 0.12 % MT SOLN
15.0000 mL | Freq: Once | OROMUCOSAL | Status: AC
  Administered 2020-09-08: 15 mL via OROMUCOSAL

## 2020-09-08 MED ORDER — PHENYLEPHRINE 40 MCG/ML (10ML) SYRINGE FOR IV PUSH (FOR BLOOD PRESSURE SUPPORT)
PREFILLED_SYRINGE | INTRAVENOUS | Status: DC | PRN
  Administered 2020-09-08: 80 ug via INTRAVENOUS

## 2020-09-08 MED ORDER — PROPOFOL 10 MG/ML IV BOLUS
INTRAVENOUS | Status: DC | PRN
  Administered 2020-09-08: 200 mg via INTRAVENOUS

## 2020-09-08 MED ORDER — BUPIVACAINE-EPINEPHRINE (PF) 0.25% -1:200000 IJ SOLN
INTRAMUSCULAR | Status: AC
  Filled 2020-09-08: qty 30

## 2020-09-08 MED ORDER — CHLORHEXIDINE GLUCONATE CLOTH 2 % EX PADS: 6.0000 | MEDICATED_PAD | Freq: Once | CUTANEOUS | Status: DC

## 2020-09-08 MED ORDER — HYDROMORPHONE HCL 1 MG/ML IJ SOLN: 0.2500 mg | INTRAMUSCULAR | Status: DC | PRN

## 2020-09-08 MED ORDER — LIDOCAINE 2% (20 MG/ML) 5 ML SYRINGE
INTRAMUSCULAR | Status: DC | PRN
  Administered 2020-09-08: 100 mg via INTRAVENOUS

## 2020-09-08 MED ORDER — FENTANYL CITRATE (PF) 100 MCG/2ML IJ SOLN
INTRAMUSCULAR | Status: AC
  Filled 2020-09-08: qty 2

## 2020-09-08 MED ORDER — FENTANYL CITRATE (PF) 100 MCG/2ML IJ SOLN
INTRAMUSCULAR | Status: DC | PRN
  Administered 2020-09-08: 100 ug via INTRAVENOUS
  Administered 2020-09-08: 50 ug via INTRAVENOUS

## 2020-09-08 MED ORDER — ORAL CARE MOUTH RINSE: 15.0000 mL | Freq: Once | OROMUCOSAL | Status: AC

## 2020-09-08 MED ORDER — DEXAMETHASONE SODIUM PHOSPHATE 10 MG/ML IJ SOLN
INTRAMUSCULAR | Status: AC
  Filled 2020-09-08: qty 1

## 2020-09-08 MED ORDER — KETOROLAC TROMETHAMINE 30 MG/ML IJ SOLN: 30.0000 mg | Freq: Once | INTRAMUSCULAR | Status: DC | PRN

## 2020-09-08 MED ORDER — OXYCODONE HCL 5 MG/5ML PO SOLN: 5.0000 mg | Freq: Once | ORAL | Status: DC | PRN

## 2020-09-08 MED ORDER — DROPERIDOL 2.5 MG/ML IJ SOLN
INTRAMUSCULAR | Status: AC
  Filled 2020-09-08: qty 2

## 2020-09-08 MED ORDER — ONDANSETRON HCL 4 MG/2ML IJ SOLN
INTRAMUSCULAR | Status: AC
  Filled 2020-09-08: qty 2

## 2020-09-08 SURGICAL SUPPLY — 50 items
ADH SKN CLS APL DERMABOND .7 (GAUZE/BANDAGES/DRESSINGS) ×1
APL PRP STRL LF DISP 70% ISPRP (MISCELLANEOUS) ×1
APL SKNCLS STERI-STRIP NONHPOA (GAUZE/BANDAGES/DRESSINGS)
APPLIER CLIP 5 13 M/L LIGAMAX5 (MISCELLANEOUS)
APPLIER CLIP ROT 10 11.4 M/L (STAPLE) ×2
APR CLP MED LRG 11.4X10 (STAPLE) ×1
APR CLP MED LRG 5 ANG JAW (MISCELLANEOUS)
BAG SPEC RTRVL 10 TROC 200 (ENDOMECHANICALS) ×1
BENZOIN TINCTURE PRP APPL 2/3 (GAUZE/BANDAGES/DRESSINGS) ×1 IMPLANT
BNDG ADH 1X3 SHEER STRL LF (GAUZE/BANDAGES/DRESSINGS) ×5 IMPLANT
BNDG ADH THN 3X1 STRL LF (GAUZE/BANDAGES/DRESSINGS) ×1
CABLE HIGH FREQUENCY MONO STRZ (ELECTRODE) ×1 IMPLANT
CHLORAPREP W/TINT 26 (MISCELLANEOUS) ×2 IMPLANT
CLIP APPLIE 5 13 M/L LIGAMAX5 (MISCELLANEOUS) IMPLANT
CLIP APPLIE ROT 10 11.4 M/L (STAPLE) IMPLANT
CLIP VESOLOCK LG 6/CT PURPLE (CLIP) IMPLANT
CLIP VESOLOCK MED LG 6/CT (CLIP) ×1 IMPLANT
COVER MAYO STAND STRL (DRAPES) ×1 IMPLANT
COVER SURGICAL LIGHT HANDLE (MISCELLANEOUS) ×2 IMPLANT
COVER WAND RF STERILE (DRAPES) IMPLANT
DECANTER SPIKE VIAL GLASS SM (MISCELLANEOUS) ×1 IMPLANT
DERMABOND ADVANCED (GAUZE/BANDAGES/DRESSINGS) ×1
DERMABOND ADVANCED .7 DNX12 (GAUZE/BANDAGES/DRESSINGS) IMPLANT
DRAIN CHANNEL 19F RND (DRAIN) IMPLANT
DRAPE C-ARM 42X120 X-RAY (DRAPES) ×1 IMPLANT
EVACUATOR SILICONE 100CC (DRAIN) IMPLANT
GLOVE SURG POLYISO LF SZ7 (GLOVE) ×2 IMPLANT
GLOVE SURG UNDER POLY LF SZ7 (GLOVE) ×2 IMPLANT
GOWN STRL REUS W/TWL LRG LVL3 (GOWN DISPOSABLE) ×2 IMPLANT
GOWN STRL REUS W/TWL XL LVL3 (GOWN DISPOSABLE) ×4 IMPLANT
GRASPER SUT TROCAR 14GX15 (MISCELLANEOUS) ×1 IMPLANT
KIT BASIN OR (CUSTOM PROCEDURE TRAY) ×2 IMPLANT
KIT TURNOVER KIT A (KITS) ×2 IMPLANT
POUCH RETRIEVAL ECOSAC 10 (ENDOMECHANICALS) ×1 IMPLANT
POUCH RETRIEVAL ECOSAC 10MM (ENDOMECHANICALS) ×2
SCISSORS LAP 5X35 DISP (ENDOMECHANICALS) ×2 IMPLANT
SET CHOLANGIOGRAPH MIX (MISCELLANEOUS) ×1 IMPLANT
SET IRRIG TUBING LAPAROSCOPIC (IRRIGATION / IRRIGATOR) ×2 IMPLANT
SET TUBE SMOKE EVAC HIGH FLOW (TUBING) ×2 IMPLANT
SLEEVE XCEL OPT CAN 5 100 (ENDOMECHANICALS) ×4 IMPLANT
STOPCOCK 4 WAY LG BORE MALE ST (IV SETS) IMPLANT
STRIP CLOSURE SKIN 1/2X4 (GAUZE/BANDAGES/DRESSINGS) ×1 IMPLANT
SUT ETHILON 2 0 PS N (SUTURE) IMPLANT
SUT MNCRL AB 4-0 PS2 18 (SUTURE) ×2 IMPLANT
SUT VICRYL 0 ENDOLOOP (SUTURE) IMPLANT
TOWEL OR 17X26 10 PK STRL BLUE (TOWEL DISPOSABLE) ×2 IMPLANT
TOWEL OR NON WOVEN STRL DISP B (DISPOSABLE) IMPLANT
TRAY LAPAROSCOPIC (CUSTOM PROCEDURE TRAY) ×2 IMPLANT
TROCAR BLADELESS OPT 5 100 (ENDOMECHANICALS) ×2 IMPLANT
TROCAR XCEL NON-BLD 11X100MML (ENDOMECHANICALS) ×2 IMPLANT

## 2020-09-08 NOTE — Op Note (Signed)
PATIENT:  Natasha Hunter  65 y.o. female  PRE-OPERATIVE DIAGNOSIS:  GALLSTONES  POST-OPERATIVE DIAGNOSIS:  GALLSTONES  PROCEDURE:  Procedure(s): LAPAROSCOPIC CHOLECYSTECTOMY WITH INTRAOPERATIVE CHOLANGIOGRAM   SURGEON:  Surgeon(s): Vraj Denardo, De Blanch, MD  ASSISTANT: none  ANESTHESIA:   local and general  Indications for procedure: Natasha Hunter is a 65 y.o. female with symptoms of Abdominal pain and Nausea and vomiting consistent with gallbladder disease, Confirmed by MRI.  Description of procedure: The patient was brought into the operative suite, placed supine. Anesthesia was administered with endotracheal tube. Patient was strapped in place and foot board was secured. All pressure points were offloaded by foam padding. The patient was prepped and draped in the usual sterile fashion.  A periumbilical incision was made and optical entry was used to enter the abdomen. 2 5 mm trocars were placed on in the right lateral space on in the right subcostal space. A 28mm trocar was placed in the subxiphoid space. Marcaine was infused to the subxiphoid space and lateral upper right abdomen in the transversus abdominis plane. Next the patient was placed in reverse trendelenberg. The gallbladder appearedfull of stones and chronically inflamed. Omentum was adhered to the gallbladder and was taken down with cautery/blunt dissection.  The gallbladder was retracted cephalad and lateral. The peritoneum was reflected off the infundibulum working lateral to medial. The cystic duct and cystic artery were identified and further dissection revealed a critical view, due to concern for choledocholithiasis a cholangiogram was performed with ductotomy and cook catheter passed through a separate subcostal stab incision. There was initially a stone in the cystic duct which was broken and removed. The ductal anatomy was then visualized without filling defect. The cystic duct and cystic artery were doubly  clipped and ligated.   The gallbladder was removed off the liver bed with cautery. The Gallbladder was placed in a specimen bag. The gallbladder fossa was irrigated and hemostasis was applied with cautery. The gallbladder was removed via the 22mm trocar. No dilation was required for removal, therefore no fascial closure was performed and loose stones were removed. Pneumoperitoneum was removed, all trocar were removed. All incisions were closed with 4-0 monocryl subcuticular stitch. The patient woke from anesthesia and was brought to PACU in stable condition. All counts were correct  Findings: chronically inflamed gallbadder with stone in the cystic duct  Specimen: gallbladder  Blood loss: 20 ml  Local anesthesia: 30 ml Marcaine  Complications: none  PLAN OF CARE: Discharge to home after PACU  PATIENT DISPOSITION:  PACU - hemodynamically stable.  Images:    Feliciana Rossetti, M.D. General, Bariatric, & Minimally Invasive Surgery [facility]

## 2020-09-08 NOTE — Discharge Instructions (Signed)
[facility] ______[facility] LAPAROSCOPIC SURGERY: POST OP INSTRUCTIONS Always review your discharge instruction sheet given to you by the facility where your surgery was performed. IF YOU HAVE DISABILITY OR FAMILY LEAVE FORMS, YOU MUST BRING THEM TO THE OFFICE FOR PROCESSING.   DO NOT GIVE THEM TO YOUR DOCTOR.  A prescription for pain medication may be given to you upon discharge.  Take your pain medication as prescribed, if needed.  If narcotic pain medicine is not needed, then you may take acetaminophen (Tylenol) or ibuprofen (Advil) as needed. Take your usually prescribed medications unless otherwise directed. If you need a refill on your pain medication, please contact your pharmacy.  They will contact our office to request authorization. Prescriptions will not be filled after 5pm or on week-ends. You should follow a light diet the first few days after arrival home, such as soup and crackers, etc.  Be sure to include lots of fluids daily. Most patients will experience some swelling and bruising in the area of the incisions.  Ice packs will help.  Swelling and bruising can take several days to resolve.  It is common to experience some constipation if taking pain medication after surgery.  Increasing fluid intake and taking a stool softener (such as Colace) will usually help or prevent this problem from occurring.  A mild laxative (Milk of Magnesia or Miralax) should be taken according to package instructions if there are no bowel movements after 48 hours. Unless discharge instructions indicate otherwise, you may remove your bandages 24-48 hours after surgery, and you may shower at that time.  You may have steri-strips (small skin tapes) in place directly over the incision.  These strips should be left on the skin for 7-10 days.  If your surgeon used skin glue on the incision, you may shower in 24 hours.  The glue will flake off over the next 2-3 weeks.  Any sutures or staples will be  removed at the office during your follow-up visit. ACTIVITIES:  You may resume regular (light) daily activities beginning the next day--such as daily self-care, walking, climbing stairs--gradually increasing activities as tolerated.  You may have sexual intercourse when it is comfortable.  Refrain from any heavy lifting or straining until approved by your doctor. You may drive when you are no longer taking prescription pain medication, you can comfortably wear a seatbelt, and you can safely maneuver your car and apply brakes. RETURN TO WORK:  __________________________________________________________ You should see your doctor in the office for a follow-up appointment approximately 2-3 weeks after your surgery.  Make sure that you call for this appointment within a day or two after you arrive home to insure a convenient appointment time. OTHER INSTRUCTIONS: __________________________________________________________________________________________________________________________ __________________________________________________________________________________________________________________________ WHEN TO CALL YOUR DOCTOR: Fever over 101.0 Inability to urinate Continued bleeding from incision. Increased pain, redness, or drainage from the incision. Increasing abdominal pain  The clinic staff is available to answer your questions during regular business hours.  Please don't hesitate to call and ask to speak to one of the nurses for clinical concerns.  If you have a medical emergency, go to the nearest emergency room or call 911.  A surgeon from [facility] is always on call at the hospital. [address], [address], [place], [place]  [address] ? [address] [address], [place], [place]   [address] [phone] ? [phone] ? FAX [phone] Web site: [email or web address]  

## 2020-09-08 NOTE — Transfer of Care (Signed)
Immediate Anesthesia Transfer of Care Note  Patient: Natasha Hunter  Procedure(s) Performed: LAPAROSCOPIC CHOLECYSTECTOMY WITH INTRAOPERATIVE CHOLANGIOGRAM (Abdomen)  Patient Location: PACU  Anesthesia Type:General  Level of Consciousness: awake, alert  and oriented  Airway & Oxygen Therapy: Patient Spontanous Breathing and Patient connected to face mask oxygen  Post-op Assessment: Report given to RN, Post -op Vital signs reviewed and stable and Patient moving all extremities X 4  Post vital signs: Reviewed and stable  Last Vitals:  Vitals Value Taken Time  BP    Temp    Pulse 91 09/08/20 1051  Resp 16 09/08/20 1051  SpO2 100 % 09/08/20 1051  Vitals shown include unvalidated device data.  Last Pain:  Vitals:   09/08/20 0805  TempSrc:   PainSc: 0-No pain      Patients Stated Pain Goal: 3 (09/08/20 0805)  Complications: No notable events documented.

## 2020-09-08 NOTE — Anesthesia Procedure Notes (Signed)
Procedure Name: Intubation Date/Time: 09/08/2020 9:38 AM Performed by: Victoriano Lain, CRNA Pre-anesthesia Checklist: Patient identified, Emergency Drugs available, Suction available, Patient being monitored and Timeout performed Patient Re-evaluated:Patient Re-evaluated prior to induction Oxygen Delivery Method: Circle system utilized Preoxygenation: Pre-oxygenation with 100% oxygen Induction Type: IV induction Ventilation: Oral airway inserted - appropriate to patient size Laryngoscope Size: Mac and 3 Grade View: Grade I Tube type: Oral Tube size: 7.5 mm Number of attempts: 1 Airway Equipment and Method: Stylet Placement Confirmation: ETT inserted through vocal cords under direct vision, positive ETCO2, breath sounds checked- equal and bilateral and CO2 detector Secured at: 23 cm Tube secured with: Tape Dental Injury: Teeth and Oropharynx as per pre-operative assessment  Comments: DL x1 [place]

## 2020-09-08 NOTE — Anesthesia Preprocedure Evaluation (Signed)
Anesthesia Evaluation  Patient identified by MRN, date of birth, ID band Patient awake    Reviewed: Allergy & Precautions, NPO status , Patient's Chart, lab work & pertinent test results  History of Anesthesia Complications (+) PONV  Airway Mallampati: II  TM Distance: >3 FB Neck ROM: Full    Dental no notable dental hx.    Pulmonary neg pulmonary ROS,    Pulmonary exam normal breath sounds clear to auscultation       Cardiovascular hypertension, Pt. on medications Normal cardiovascular exam Rhythm:Regular Rate:Normal     Neuro/Psych negative neurological ROS  negative psych ROS   GI/Hepatic negative GI ROS, Neg liver ROS,   Endo/Other  negative endocrine ROS  Renal/GU negative Renal ROS  negative genitourinary   Musculoskeletal negative musculoskeletal ROS (+)   Abdominal   Peds negative pediatric ROS (+)  Hematology negative hematology ROS (+)   Anesthesia Other Findings   Reproductive/Obstetrics negative OB ROS                             Anesthesia Physical Anesthesia Plan  ASA: 2  Anesthesia Plan: General   Post-op Pain Management:    Induction: Intravenous  PONV Risk Score and Plan: 4 or greater and Ondansetron, Dexamethasone, Treatment may vary due to age or medical condition and Droperidol  Airway Management Planned: Oral ETT  Additional Equipment:   Intra-op Plan:   Post-operative Plan: Extubation in OR  Informed Consent: I have reviewed the patients History and Physical, chart, labs and discussed the procedure including the risks, benefits and alternatives for the proposed anesthesia with the patient or authorized representative who has indicated his/her understanding and acceptance.     Dental advisory given  Plan Discussed with: CRNA and Surgeon  Anesthesia Plan Comments:         Anesthesia Quick Evaluation

## 2020-09-08 NOTE — Anesthesia Postprocedure Evaluation (Signed)
Anesthesia Post Note  Patient: Natasha Hunter  Procedure(s) Performed: LAPAROSCOPIC CHOLECYSTECTOMY WITH INTRAOPERATIVE CHOLANGIOGRAM (Abdomen)     Patient location during evaluation: PACU Anesthesia Type: General Level of consciousness: awake and alert Pain management: pain level controlled Vital Signs Assessment: post-procedure vital signs reviewed and stable Respiratory status: spontaneous breathing, nonlabored ventilation, respiratory function stable and patient connected to nasal cannula oxygen Cardiovascular status: blood pressure returned to baseline and stable Postop Assessment: no apparent nausea or vomiting Anesthetic complications: no   No notable events documented.  Last Vitals:  Vitals:   09/08/20 1100 09/08/20 1115  BP: (!) 157/84 (!) 149/84  Pulse: 85 79  Resp: 15 16  Temp:  36.4 C  SpO2: 100% 99%    Last Pain:  Vitals:   09/08/20 1115  TempSrc:   PainSc: 0-No pain                 Yeilyn Gent S

## 2020-09-08 NOTE — H&P (Signed)
Natasha Hunter is an 65 y.o. female.   Chief Complaint: abdominal pain HPI: 65 yo female with intermittent abdominal pain. She underwent MRI showing multiple gallstones. She presents for surgery  Past Medical History:  Diagnosis Date   Arthritis    Asthma    INFFREQUENT PROBLEMS - DOES NOT HAVE INHALER   Complication of anesthesia    Depression    Difficulty sleeping    Gall stones    Genital herpes    History of transfusion    Hypertension    PONV (postoperative nausea and vomiting)     Past Surgical History:  Procedure Laterality Date   ABDOMINAL HYSTERECTOMY  2001   ABORATIONS     X2   BREAST BIOPSY Right 2020   JOINT REPLACEMENT  2007   RT KNEE   KNEE ARTHROSCOPY  2005   rt knee   PARTIAL HIP ARTHROPLASTY Right    TOTAL KNEE ARTHROPLASTY Left 05/17/2014   Procedure: LEFT TOTAL KNEE ARTHROPLASTY;  Surgeon: Shelda Pal, MD;  Location: [facility];  Service: Orthopedics;  Laterality: Left;    Family History  Problem Relation Age of Onset   Diabetes Mother    High Cholesterol Mother    Kidney Stones Mother    Breast cancer Maternal Aunt    Social History:  reports that she has never smoked. She has never used smokeless tobacco. She reports that she does not drink alcohol and does not use drugs.  Allergies:  Allergies  Allergen Reactions   Darvon [Propoxyphene] Other (See Comments)    Hallucinations   Sulfa Antibiotics Other (See Comments)    Large welps   Broccoli [Brassica Oleracea] Rash   Cabbage Rash    Medications Prior to Admission  Medication Sig Dispense Refill   acyclovir (ZOVIRAX) 400 MG tablet Take 400 mg by mouth 2 (two) times daily.     amLODipine (NORVASC) 10 MG tablet Take 10 mg by mouth daily.     aspirin EC 81 MG tablet Take 81 mg by mouth daily. Swallow whole.     atenolol (TENORMIN) 50 MG tablet Take 50 mg by mouth 2 (two) times daily.      Multiple Vitamins-Minerals (IMMUNE SUPPORT PO) Take 2 mg by mouth daily.     niacin 500 MG tablet  Take 500 mg by mouth daily.     rosuvastatin (CRESTOR) 10 MG tablet Take 10 mg by mouth daily.     traZODone (DESYREL) 50 MG tablet Take 150 mg by mouth at bedtime.     albuterol (VENTOLIN HFA) 108 (90 Base) MCG/ACT inhaler Inhale 1 puff into the lungs every 6 (six) hours as needed for wheezing.     COVID-19 mRNA vaccine, Pfizer, 30 MCG/0.3ML injection USE AS DIRECTED .3 mL 0    No results found for this or any previous visit (from the past 48 hour(s)). No results found.  Review of Systems  Constitutional:  Negative for chills and fever.  HENT:  Negative for hearing loss.   Respiratory:  Negative for cough.   Cardiovascular:  Negative for chest pain and palpitations.  Gastrointestinal:  Negative for abdominal pain, nausea and vomiting.  Genitourinary:  Negative for dysuria and urgency.  Musculoskeletal:  Negative for myalgias and neck pain.  Skin:  Negative for rash.  Neurological:  Negative for dizziness and headaches.  Hematological:  Does not bruise/bleed easily.  Psychiatric/Behavioral:  Negative for suicidal ideas.    Blood pressure 132/90, pulse 81, temperature 98.5 F (36.9 C), temperature source  Oral, resp. rate 16, height 5\' 9"  (1.753 m), weight 85.3 kg, SpO2 99 %. Physical Exam Vitals reviewed.  Constitutional:      Appearance: She is well-developed.  HENT:     Head: Normocephalic and atraumatic.  Eyes:     Conjunctiva/sclera: Conjunctivae normal.     Pupils: Pupils are equal, round, and reactive to light.  Cardiovascular:     Rate and Rhythm: Normal rate and regular rhythm.  Pulmonary:     Effort: Pulmonary effort is normal.     Breath sounds: Normal breath sounds.  Abdominal:     General: Bowel sounds are normal. There is no distension.     Palpations: Abdomen is soft.     Tenderness: There is no abdominal tenderness.  Musculoskeletal:        General: Normal range of motion.     Cervical back: Normal range of motion and neck supple.  Skin:    General: Skin  is warm and dry.  Neurological:     Mental Status: She is alert and oriented to person, place, and time.  Psychiatric:        Behavior: Behavior normal.     Assessment/Plan 65 yo female with abdominal pain consistent biliary disease -lap chole -[facility]AS protocol -planned outpatient procedure  76, MD 09/08/2020, 9:06 AM

## 2020-09-09 ENCOUNTER — Encounter (HOSPITAL_COMMUNITY): Payer: Self-pay | Admitting: General Surgery

## 2020-09-09 LAB — SURGICAL PATHOLOGY

## 2020-09-13 DIAGNOSIS — E782 Mixed hyperlipidemia: Secondary | ICD-10-CM | POA: Diagnosis not present

## 2020-09-13 DIAGNOSIS — Z9049 Acquired absence of other specified parts of digestive tract: Secondary | ICD-10-CM | POA: Diagnosis not present

## 2020-09-13 DIAGNOSIS — I10 Essential (primary) hypertension: Secondary | ICD-10-CM | POA: Diagnosis not present

## 2020-09-13 DIAGNOSIS — G4700 Insomnia, unspecified: Secondary | ICD-10-CM | POA: Diagnosis not present

## 2020-09-13 DIAGNOSIS — I700 Atherosclerosis of aorta: Secondary | ICD-10-CM | POA: Diagnosis not present

## 2020-10-26 DIAGNOSIS — H019 Unspecified inflammation of eyelid: Secondary | ICD-10-CM | POA: Diagnosis not present

## 2020-11-09 DIAGNOSIS — H0011 Chalazion right upper eyelid: Secondary | ICD-10-CM | POA: Diagnosis not present

## 2020-11-09 DIAGNOSIS — R350 Frequency of micturition: Secondary | ICD-10-CM | POA: Diagnosis not present

## 2020-11-09 DIAGNOSIS — R7301 Impaired fasting glucose: Secondary | ICD-10-CM | POA: Diagnosis not present

## 2020-11-11 ENCOUNTER — Other Ambulatory Visit (HOSPITAL_COMMUNITY): Payer: Self-pay

## 2020-11-13 DIAGNOSIS — M542 Cervicalgia: Secondary | ICD-10-CM | POA: Diagnosis not present

## 2020-11-13 DIAGNOSIS — M62838 Other muscle spasm: Secondary | ICD-10-CM | POA: Diagnosis not present

## 2020-11-15 DIAGNOSIS — R350 Frequency of micturition: Secondary | ICD-10-CM | POA: Diagnosis not present

## 2020-11-15 DIAGNOSIS — R7301 Impaired fasting glucose: Secondary | ICD-10-CM | POA: Diagnosis not present

## 2020-12-14 DIAGNOSIS — Z23 Encounter for immunization: Secondary | ICD-10-CM | POA: Diagnosis not present

## 2020-12-14 DIAGNOSIS — I10 Essential (primary) hypertension: Secondary | ICD-10-CM | POA: Diagnosis not present

## 2020-12-14 DIAGNOSIS — M25512 Pain in left shoulder: Secondary | ICD-10-CM | POA: Diagnosis not present

## 2020-12-14 DIAGNOSIS — E782 Mixed hyperlipidemia: Secondary | ICD-10-CM | POA: Diagnosis not present

## 2020-12-14 DIAGNOSIS — M25511 Pain in right shoulder: Secondary | ICD-10-CM | POA: Diagnosis not present

## 2021-01-10 ENCOUNTER — Other Ambulatory Visit (HOSPITAL_COMMUNITY): Payer: Self-pay

## 2021-01-12 DIAGNOSIS — Z96653 Presence of artificial knee joint, bilateral: Secondary | ICD-10-CM | POA: Diagnosis not present

## 2021-01-23 DIAGNOSIS — H43391 Other vitreous opacities, right eye: Secondary | ICD-10-CM | POA: Diagnosis not present

## 2021-01-23 DIAGNOSIS — H43811 Vitreous degeneration, right eye: Secondary | ICD-10-CM | POA: Diagnosis not present

## 2021-01-23 DIAGNOSIS — H2513 Age-related nuclear cataract, bilateral: Secondary | ICD-10-CM | POA: Diagnosis not present

## 2021-02-13 DIAGNOSIS — M7582 Other shoulder lesions, left shoulder: Secondary | ICD-10-CM | POA: Diagnosis not present

## 2021-03-15 DIAGNOSIS — I10 Essential (primary) hypertension: Secondary | ICD-10-CM | POA: Diagnosis not present

## 2021-03-15 DIAGNOSIS — E782 Mixed hyperlipidemia: Secondary | ICD-10-CM | POA: Diagnosis not present

## 2021-03-15 DIAGNOSIS — G4700 Insomnia, unspecified: Secondary | ICD-10-CM | POA: Diagnosis not present

## 2021-04-06 ENCOUNTER — Other Ambulatory Visit (HOSPITAL_COMMUNITY): Payer: Self-pay | Admitting: Orthopedic Surgery

## 2021-04-06 ENCOUNTER — Other Ambulatory Visit: Payer: Self-pay | Admitting: Orthopedic Surgery

## 2021-04-06 DIAGNOSIS — M25562 Pain in left knee: Secondary | ICD-10-CM | POA: Diagnosis not present

## 2021-04-06 DIAGNOSIS — Z96652 Presence of left artificial knee joint: Secondary | ICD-10-CM | POA: Diagnosis not present

## 2021-04-06 DIAGNOSIS — M25552 Pain in left hip: Secondary | ICD-10-CM | POA: Diagnosis not present

## 2021-04-21 ENCOUNTER — Other Ambulatory Visit: Payer: Self-pay

## 2021-04-21 ENCOUNTER — Ambulatory Visit (HOSPITAL_COMMUNITY)
Admission: RE | Admit: 2021-04-21 | Discharge: 2021-04-21 | Disposition: A | Discharge status: Home / Self Care | Payer: Medicare Other | Source: Ambulatory Visit | Attending: Orthopedic Surgery | Admitting: Orthopedic Surgery

## 2021-04-21 ENCOUNTER — Encounter (HOSPITAL_COMMUNITY)
Admission: RE | Admit: 2021-04-21 | Discharge: 2021-04-21 | Disposition: A | Discharge status: Home / Self Care | Payer: Medicare Other | Source: Ambulatory Visit | Attending: Orthopedic Surgery | Admitting: Orthopedic Surgery

## 2021-04-21 DIAGNOSIS — Z96652 Presence of left artificial knee joint: Secondary | ICD-10-CM | POA: Diagnosis not present

## 2021-04-21 DIAGNOSIS — M25562 Pain in left knee: Secondary | ICD-10-CM

## 2021-04-21 DIAGNOSIS — Z96653 Presence of artificial knee joint, bilateral: Secondary | ICD-10-CM | POA: Diagnosis not present

## 2021-05-11 DIAGNOSIS — M25562 Pain in left knee: Secondary | ICD-10-CM | POA: Diagnosis not present

## 2021-05-18 DIAGNOSIS — Z96653 Presence of artificial knee joint, bilateral: Secondary | ICD-10-CM | POA: Diagnosis not present

## 2021-05-18 DIAGNOSIS — T8484XA Pain due to internal orthopedic prosthetic devices, implants and grafts, initial encounter: Secondary | ICD-10-CM | POA: Diagnosis not present

## 2021-05-18 DIAGNOSIS — M25562 Pain in left knee: Secondary | ICD-10-CM | POA: Diagnosis not present

## 2021-05-30 ENCOUNTER — Other Ambulatory Visit: Payer: Self-pay | Admitting: Family Medicine

## 2021-05-30 DIAGNOSIS — Z1231 Encounter for screening mammogram for malignant neoplasm of breast: Secondary | ICD-10-CM

## 2021-05-30 DIAGNOSIS — H5213 Myopia, bilateral: Secondary | ICD-10-CM | POA: Diagnosis not present

## 2021-05-30 DIAGNOSIS — H2513 Age-related nuclear cataract, bilateral: Secondary | ICD-10-CM | POA: Diagnosis not present

## 2021-05-30 DIAGNOSIS — H43811 Vitreous degeneration, right eye: Secondary | ICD-10-CM | POA: Diagnosis not present

## 2021-06-20 NOTE — Progress Notes (Signed)
DUE TO COVID-19 ONLY ONE VISITOR IS ALLOWED TO COME WITH YOU AND STAY IN THE WAITING ROOM ONLY DURING PRE OP AND PROCEDURE DAY OF SURGERY.  2 VISITOR  MAY VISIT WITH YOU AFTER SURGERY IN YOUR PRIVATE ROOM DURING VISITING HOURS ONLY! ?YOU MAY HAVE ONE PERSON SPEND THE NITE WITH YOU IN YOUR ROOM AFTER SURGERY.   ? ? ?Procedure scheduled on 07/06/2021.  ? Report to [facility] Main  Entrance ? ? Report to admitting at         550         AM ?DO NOT BRING INSURANCE CARD, PICTURE ID OR WALLET DAY OF SURGERY.  ?  ? ? Call this number if you have problems the morning of surgery [phone]  ? ? REMEMBER: NO  SOLID FOODS , CANDY, GUM OR MINTS AFTER MIDNITE THE NITE BEFORE SURGERY .       Marland Kitchen CLEAR LIQUIDS UNTIL      0540am            DAY OF SURGERY.      PLEASE FINISH ENSURE DRINK PER SURGEON ORDER  WHICH NEEDS TO BE COMPLETED AT   0540      MORNING OF SURGERY.   ? ? ? ? ?CLEAR LIQUID DIET ? ? ?Foods Allowed      ?WATER ?BLACK COFFEE ( SUGAR OK, NO MILK, CREAM OR CREAMER) REGULAR AND DECAF  ?TEA ( SUGAR OK NO MILK, CREAM, OR CREAMER) REGULAR AND DECAF  ?PLAIN JELLO ( NO RED)  ?FRUIT ICES ( NO RED, NO FRUIT PULP)  ?POPSICLES ( NO RED)  ?JUICE- APPLE, WHITE GRAPE AND WHITE CRANBERRY  ?SPORT DRINK LIKE GATORADE ( NO RED)  ?CLEAR BROTH ( VEGETABLE , CHICKEN OR BEEF)                                                               ? ?    ? ?BRUSH YOUR TEETH MORNING OF SURGERY AND RINSE YOUR MOUTH OUT, NO CHEWING GUM CANDY OR MINTS. ?  ? ? Take these medicines the morning of surgery with A SIP OF WATER:  inhalers as usual and bring, amlodipine, atenolol  ? ? ?DO NOT TAKE ANY DIABETIC MEDICATIONS DAY OF YOUR SURGERY ?                  ?            You may not have any metal on your body including hair pins and  ?            piercings  Do not wear jewelry, make-up, lotions, powders or perfumes, deodorant ?            Do not wear nail polish on your fingernails.   ?           IF YOU ARE A FEMALE AND WANT TO SHAVE UNDER ARMS OR  LEGS PRIOR TO SURGERY YOU MUST DO SO AT LEAST 48 HOURS PRIOR TO SURGERY.  ?            Men may shave face and neck. ? ? Do not bring valuables to the hospital. [facility] IS NOT ?            RESPONSIBLE   FOR  VALUABLES. ? Contacts, dentures or bridgework may not be worn into surgery. ? Leave suitcase in the car. After surgery it may be brought to your room. ? ?  ? Patients discharged the day of surgery will not be allowed to drive home. IF YOU ARE HAVING SURGERY AND GOING HOME THE SAME DAY, YOU MUST HAVE AN ADULT TO DRIVE YOU HOME AND BE WITH YOU FOR 24 HOURS. YOU MAY GO HOME BY TAXI OR UBER OR ORTHERWISE, BUT AN ADULT MUST ACCOMPANY YOU HOME AND STAY WITH YOU FOR 24 HOURS. ?  ? ?            Please read over the following fact sheets you were given: ?_____________________________________________________________________ ? ? - Preparing for Surgery ?Before surgery, you can play an important role.  Because skin is not sterile, your skin needs to be as free of germs as possible.  You can reduce the number of germs on your skin by washing with CHG (chlorahexidine gluconate) soap before surgery.  CHG is an antiseptic cleaner which kills germs and bonds with the skin to continue killing germs even after washing. ?Please DO NOT use if you have an allergy to CHG or antibacterial soaps.  If your skin becomes reddened/irritated stop using the CHG and inform your nurse when you arrive at Short Stay. ?Do not shave (including legs and underarms) for at least 48 hours prior to the first CHG shower.  You may shave your face/neck. ?Please follow these instructions carefully: ? 1.  Shower with CHG Soap the night before surgery and the  morning of Surgery. ? 2.  If you choose to wash your hair, wash your hair first as usual with your  normal  shampoo. ? 3.  After you shampoo, rinse your hair and body thoroughly to remove the  shampoo.                           4.  Use CHG as you would any other liquid soap.  You can apply  chg directly  to the skin and wash  ?                     Gently with a scrungie or clean washcloth. ? 5.  Apply the CHG Soap to your body ONLY FROM THE NECK DOWN.   Do not use on face/ open      ?                     Wound or open sores. Avoid contact with eyes, ears mouth and genitals (private parts).  ?                     Engineering geologist,  Genitals (private parts) with your normal soap. ?            6.  Wash thoroughly, paying special attention to the area where your surgery  will be performed. ? 7.  Thoroughly rinse your body with warm water from the neck down. ? 8.  DO NOT shower/wash with your normal soap after using and rinsing off  the CHG Soap. ?               9.  Pat yourself dry with a clean towel. ?           10.  Wear clean pajamas. ?  11.  Place clean sheets on your bed the night of your first shower and do not  sleep with pets. ?Day of Surgery : ?Do not apply any lotions/deodorants the morning of surgery.  Please wear clean clothes to the hospital/surgery center. ? ?FAILURE TO FOLLOW THESE INSTRUCTIONS MAY RESULT IN THE CANCELLATION OF YOUR SURGERY ?PATIENT SIGNATURE_________________________________ ? ?NURSE SIGNATURE__________________________________ ? ?________________________________________________________________________  ? ? ?           ?

## 2021-06-20 NOTE — Progress Notes (Addendum)
Anesthesia Review: ? ?PCP: Dr Caren Macadam- appt on 06/23/21  ?Clearance on chart with no date.  LOV 06/23/21 on chart along with labs. _ OV for preop  ?clearance.  Clearance on chart dated 06/30/21.   ?Cardiologist : none  ?Chest x-ray : 06/27/21 on chart  ?EKG :09/01/20  ?Echo : ?Stress test: ?Cardiac Cath :  ?Activity level: can do a flgiht of stairs without difficulty  ?Sleep Study/ CPAP : none  ?Fasting Blood Sugar :      / Checks Blood Sugar -- times a day:   ?Blood Thinner/ Instructions /Last Dose: ?ASA / Instructions/ Last Dose :   ?81 mg aspirin  ? ?PT in for peop on 06/23/21.  PT states she is having a partial knee replacement not a total knee replacement.  PT did not isgn consent at preop .  To see PA on 06/26/21.  Called and soke with Orson Slick at office she states is to be partial .  Orson Slick stated she would have Ashley,PA correct consent form.   ?

## 2021-06-21 ENCOUNTER — Other Ambulatory Visit (HOSPITAL_COMMUNITY): Payer: Self-pay

## 2021-06-21 DIAGNOSIS — M25562 Pain in left knee: Secondary | ICD-10-CM | POA: Diagnosis not present

## 2021-06-21 DIAGNOSIS — E782 Mixed hyperlipidemia: Secondary | ICD-10-CM | POA: Diagnosis not present

## 2021-06-21 DIAGNOSIS — Z01818 Encounter for other preprocedural examination: Secondary | ICD-10-CM | POA: Diagnosis not present

## 2021-06-21 DIAGNOSIS — R8290 Unspecified abnormal findings in urine: Secondary | ICD-10-CM | POA: Diagnosis not present

## 2021-06-23 ENCOUNTER — Encounter (HOSPITAL_COMMUNITY): Payer: Self-pay

## 2021-06-23 ENCOUNTER — Encounter (HOSPITAL_COMMUNITY)
Admission: RE | Admit: 2021-06-23 | Discharge: 2021-06-23 | Disposition: A | Discharge status: Home / Self Care | Payer: Medicare Other | Source: Ambulatory Visit | Attending: Orthopedic Surgery | Admitting: Orthopedic Surgery

## 2021-06-23 ENCOUNTER — Other Ambulatory Visit: Payer: Self-pay

## 2021-06-23 VITALS — BP 149/93 | HR 75 | Temp 98.7°F | Resp 16 | Ht 70.0 in | Wt 186.0 lb

## 2021-06-23 DIAGNOSIS — I10 Essential (primary) hypertension: Secondary | ICD-10-CM | POA: Diagnosis not present

## 2021-06-23 DIAGNOSIS — M1712 Unilateral primary osteoarthritis, left knee: Secondary | ICD-10-CM | POA: Diagnosis not present

## 2021-06-23 DIAGNOSIS — Z01818 Encounter for other preprocedural examination: Secondary | ICD-10-CM

## 2021-06-23 DIAGNOSIS — G479 Sleep disorder, unspecified: Secondary | ICD-10-CM | POA: Diagnosis not present

## 2021-06-23 DIAGNOSIS — E782 Mixed hyperlipidemia: Secondary | ICD-10-CM | POA: Diagnosis not present

## 2021-06-23 DIAGNOSIS — Z01812 Encounter for preprocedural laboratory examination: Secondary | ICD-10-CM | POA: Diagnosis not present

## 2021-06-23 DIAGNOSIS — I700 Atherosclerosis of aorta: Secondary | ICD-10-CM | POA: Diagnosis not present

## 2021-06-23 DIAGNOSIS — Z0000 Encounter for general adult medical examination without abnormal findings: Secondary | ICD-10-CM | POA: Diagnosis not present

## 2021-06-23 DIAGNOSIS — Z23 Encounter for immunization: Secondary | ICD-10-CM | POA: Diagnosis not present

## 2021-06-23 HISTORY — DX: Gastro-esophageal reflux disease without esophagitis: K21.9

## 2021-06-23 LAB — COMPREHENSIVE METABOLIC PANEL
ALT: 23 U/L (ref 0–44)
AST: 24 U/L (ref 15–41)
Albumin: 3.9 g/dL (ref 3.5–5.0)
Alkaline Phosphatase: 79 U/L (ref 38–126)
Anion gap: 8 (ref 5–15)
BUN: 17 mg/dL (ref 8–23)
CO2: 24 mmol/L (ref 22–32)
Calcium: 9.4 mg/dL (ref 8.9–10.3)
Chloride: 104 mmol/L (ref 98–111)
Creatinine, Ser: 0.88 mg/dL (ref 0.44–1.00)
GFR, Estimated: >60 mL/min (ref >60)
Glucose, Bld: 99 mg/dL (ref 70–99)
Potassium: 4.4 mmol/L (ref 3.5–5.1)
Sodium: 136 mmol/L (ref 135–145)
Total Bilirubin: 0.4 mg/dL (ref 0.3–1.2)
Total Protein: 7.6 g/dL (ref 6.5–8.1)

## 2021-06-23 LAB — CBC
HCT: 40.6 % (ref 36.0–46.0)
Hemoglobin: 13.5 g/dL (ref 12.0–15.0)
MCH: 32 pg (ref 26.0–34.0)
MCHC: 33.3 g/dL (ref 30.0–36.0)
MCV: 96.2 fL (ref 80.0–100.0)
Platelets: 259 10*3/uL (ref 150–400)
RBC: 4.22 MIL/uL (ref 3.87–5.11)
RDW: 12.9 % (ref 11.5–15.5)
WBC: 9.2 10*3/uL (ref 4.0–10.5)
nRBC: 0 % (ref 0.0–0.2)

## 2021-06-23 LAB — TYPE AND SCREEN
ABO/RH(D): A POS
Antibody Screen: NEGATIVE

## 2021-06-23 LAB — SURGICAL PCR SCREEN
MRSA, PCR: NEGATIVE
Staphylococcus aureus: NEGATIVE

## 2021-06-27 ENCOUNTER — Other Ambulatory Visit: Payer: Self-pay | Admitting: Family Medicine

## 2021-06-27 ENCOUNTER — Ambulatory Visit
Admission: RE | Admit: 2021-06-27 | Discharge: 2021-06-27 | Disposition: A | Discharge status: Home / Self Care | Payer: Medicare Other | Source: Ambulatory Visit | Attending: Family Medicine | Admitting: Family Medicine

## 2021-06-27 DIAGNOSIS — Z01818 Encounter for other preprocedural examination: Secondary | ICD-10-CM | POA: Diagnosis not present

## 2021-06-27 DIAGNOSIS — M47814 Spondylosis without myelopathy or radiculopathy, thoracic region: Secondary | ICD-10-CM | POA: Diagnosis not present

## 2021-06-27 DIAGNOSIS — Z96652 Presence of left artificial knee joint: Secondary | ICD-10-CM | POA: Diagnosis not present

## 2021-06-27 DIAGNOSIS — R918 Other nonspecific abnormal finding of lung field: Secondary | ICD-10-CM | POA: Diagnosis not present

## 2021-07-06 ENCOUNTER — Inpatient Hospital Stay (HOSPITAL_COMMUNITY)
Admission: RE | Admit: 2021-07-06 | Discharge: 2021-07-07 | DRG: 468 | Disposition: A | Discharge status: Home / Self Care | Payer: Medicare Other | Attending: Orthopedic Surgery | Admitting: Orthopedic Surgery

## 2021-07-06 ENCOUNTER — Other Ambulatory Visit: Payer: Self-pay

## 2021-07-06 ENCOUNTER — Encounter (HOSPITAL_COMMUNITY)
Admission: RE | Disposition: A | Discharge status: Home / Self Care | Payer: Self-pay | Source: Home / Self Care | Attending: Orthopedic Surgery

## 2021-07-06 ENCOUNTER — Inpatient Hospital Stay (HOSPITAL_COMMUNITY): Payer: Medicare Other | Admitting: Anesthesiology

## 2021-07-06 ENCOUNTER — Inpatient Hospital Stay (HOSPITAL_COMMUNITY): Payer: Medicare Other | Admitting: Physician Assistant

## 2021-07-06 ENCOUNTER — Encounter (HOSPITAL_COMMUNITY): Payer: Self-pay | Admitting: Orthopedic Surgery

## 2021-07-06 DIAGNOSIS — Z881 Allergy status to other antibiotic agents status: Secondary | ICD-10-CM | POA: Diagnosis not present

## 2021-07-06 DIAGNOSIS — T8484XA Pain due to internal orthopedic prosthetic devices, implants and grafts, initial encounter: Secondary | ICD-10-CM | POA: Diagnosis not present

## 2021-07-06 DIAGNOSIS — Z803 Family history of malignant neoplasm of breast: Secondary | ICD-10-CM

## 2021-07-06 DIAGNOSIS — Z96641 Presence of right artificial hip joint: Secondary | ICD-10-CM | POA: Diagnosis not present

## 2021-07-06 DIAGNOSIS — E669 Obesity, unspecified: Secondary | ICD-10-CM | POA: Diagnosis present

## 2021-07-06 DIAGNOSIS — Y792 Prosthetic and other implants, materials and accessory orthopedic devices associated with adverse incidents: Secondary | ICD-10-CM | POA: Diagnosis present

## 2021-07-06 DIAGNOSIS — Z9049 Acquired absence of other specified parts of digestive tract: Secondary | ICD-10-CM

## 2021-07-06 DIAGNOSIS — G8918 Other acute postprocedural pain: Secondary | ICD-10-CM | POA: Diagnosis not present

## 2021-07-06 DIAGNOSIS — E781 Pure hyperglyceridemia: Secondary | ICD-10-CM | POA: Diagnosis not present

## 2021-07-06 DIAGNOSIS — Z91018 Allergy to other foods: Secondary | ICD-10-CM | POA: Diagnosis not present

## 2021-07-06 DIAGNOSIS — T84033A Mechanical loosening of internal left knee prosthetic joint, initial encounter: Secondary | ICD-10-CM | POA: Diagnosis not present

## 2021-07-06 DIAGNOSIS — Z83438 Family history of other disorder of lipoprotein metabolism and other lipidemia: Secondary | ICD-10-CM | POA: Diagnosis not present

## 2021-07-06 DIAGNOSIS — K219 Gastro-esophageal reflux disease without esophagitis: Secondary | ICD-10-CM | POA: Diagnosis present

## 2021-07-06 DIAGNOSIS — Z6826 Body mass index (BMI) 26.0-26.9, adult: Secondary | ICD-10-CM

## 2021-07-06 DIAGNOSIS — I10 Essential (primary) hypertension: Secondary | ICD-10-CM | POA: Diagnosis not present

## 2021-07-06 DIAGNOSIS — J4520 Mild intermittent asthma, uncomplicated: Secondary | ICD-10-CM | POA: Diagnosis not present

## 2021-07-06 DIAGNOSIS — Z96653 Presence of artificial knee joint, bilateral: Secondary | ICD-10-CM | POA: Diagnosis present

## 2021-07-06 DIAGNOSIS — M00862 Arthritis due to other bacteria, left knee: Secondary | ICD-10-CM | POA: Diagnosis not present

## 2021-07-06 DIAGNOSIS — Z833 Family history of diabetes mellitus: Secondary | ICD-10-CM

## 2021-07-06 DIAGNOSIS — Z888 Allergy status to other drugs, medicaments and biological substances status: Secondary | ICD-10-CM

## 2021-07-06 DIAGNOSIS — M1712 Unilateral primary osteoarthritis, left knee: Principal | ICD-10-CM

## 2021-07-06 DIAGNOSIS — Z90710 Acquired absence of both cervix and uterus: Secondary | ICD-10-CM

## 2021-07-06 DIAGNOSIS — Z96652 Presence of left artificial knee joint: Secondary | ICD-10-CM

## 2021-07-06 DIAGNOSIS — F419 Anxiety disorder, unspecified: Secondary | ICD-10-CM | POA: Diagnosis present

## 2021-07-06 HISTORY — PX: TOTAL KNEE ARTHROPLASTY WITH REVISION COMPONENTS: SHX6198

## 2021-07-06 SURGERY — TOTAL KNEE ARTHROPLASTY WITH REVISION COMPONENTS
Anesthesia: Regional | Site: Knee | Laterality: Left

## 2021-07-06 MED ORDER — PHENYLEPHRINE HCL-NACL 20-0.9 MG/250ML-% IV SOLN
INTRAVENOUS | Status: DC | PRN
  Administered 2021-07-06: 40 ug/min via INTRAVENOUS

## 2021-07-06 MED ORDER — EPHEDRINE SULFATE (PRESSORS) 50 MG/ML IJ SOLN
INTRAMUSCULAR | Status: DC | PRN
  Administered 2021-07-06 (×2): 10 mg via INTRAVENOUS

## 2021-07-06 MED ORDER — PHENYLEPHRINE HCL (PRESSORS) 10 MG/ML IV SOLN
INTRAVENOUS | Status: AC
  Filled 2021-07-06: qty 1

## 2021-07-06 MED ORDER — ATENOLOL 50 MG PO TABS
50.0000 mg | ORAL_TABLET | Freq: Two times a day (BID) | ORAL | Status: DC
  Administered 2021-07-06 – 2021-07-07 (×2): 50 mg via ORAL
  Filled 2021-07-06 (×2): qty 1

## 2021-07-06 MED ORDER — DOCUSATE SODIUM 100 MG PO CAPS
100.0000 mg | ORAL_CAPSULE | Freq: Two times a day (BID) | ORAL | Status: DC
  Administered 2021-07-06 – 2021-07-07 (×2): 100 mg via ORAL
  Filled 2021-07-06 (×2): qty 1

## 2021-07-06 MED ORDER — ACETAMINOPHEN 10 MG/ML IV SOLN
1000.0000 mg | Freq: Once | INTRAVENOUS | Status: DC | PRN
  Administered 2021-07-06: 1000 mg via INTRAVENOUS

## 2021-07-06 MED ORDER — HYDROMORPHONE HCL 1 MG/ML IJ SOLN
0.5000 mg | INTRAMUSCULAR | Status: DC | PRN
  Administered 2021-07-06 (×2): 1 mg via INTRAVENOUS
  Filled 2021-07-06 (×3): qty 1

## 2021-07-06 MED ORDER — ONDANSETRON HCL 4 MG/2ML IJ SOLN
4.0000 mg | Freq: Four times a day (QID) | INTRAMUSCULAR | Status: DC | PRN
  Administered 2021-07-06: 4 mg via INTRAVENOUS
  Filled 2021-07-06: qty 2

## 2021-07-06 MED ORDER — FENTANYL CITRATE (PF) 100 MCG/2ML IJ SOLN
INTRAMUSCULAR | Status: AC
  Filled 2021-07-06: qty 2

## 2021-07-06 MED ORDER — PROPOFOL 500 MG/50ML IV EMUL
INTRAVENOUS | Status: DC | PRN
  Administered 2021-07-06: 75 ug/kg/min via INTRAVENOUS

## 2021-07-06 MED ORDER — AMLODIPINE BESYLATE 10 MG PO TABS
10.0000 mg | ORAL_TABLET | Freq: Every day | ORAL | Status: DC
  Administered 2021-07-07: 10 mg via ORAL
  Filled 2021-07-06: qty 1

## 2021-07-06 MED ORDER — OXYCODONE HCL 5 MG PO TABS: 5.0000 mg | ORAL_TABLET | Freq: Once | ORAL | Status: DC | PRN

## 2021-07-06 MED ORDER — LACTATED RINGERS IV SOLN: INTRAVENOUS | Status: DC

## 2021-07-06 MED ORDER — ACETAMINOPHEN 10 MG/ML IV SOLN
INTRAVENOUS | Status: AC
  Filled 2021-07-06: qty 100

## 2021-07-06 MED ORDER — METOCLOPRAMIDE HCL 5 MG/ML IJ SOLN
5.0000 mg | Freq: Three times a day (TID) | INTRAMUSCULAR | Status: DC | PRN
  Administered 2021-07-06: 10 mg via INTRAVENOUS
  Filled 2021-07-06: qty 2

## 2021-07-06 MED ORDER — FENTANYL CITRATE PF 50 MCG/ML IJ SOSY
PREFILLED_SYRINGE | INTRAMUSCULAR | Status: AC
  Filled 2021-07-06: qty 3

## 2021-07-06 MED ORDER — POVIDONE-IODINE 10 % EX SWAB
2.0000 "application " | Freq: Once | CUTANEOUS | Status: AC
  Administered 2021-07-06: 2 via TOPICAL

## 2021-07-06 MED ORDER — BUPIVACAINE-EPINEPHRINE (PF) 0.25% -1:200000 IJ SOLN
INTRAMUSCULAR | Status: AC
  Filled 2021-07-06: qty 30

## 2021-07-06 MED ORDER — ALBUTEROL SULFATE (2.5 MG/3ML) 0.083% IN NEBU: 3.0000 mL | INHALATION_SOLUTION | Freq: Four times a day (QID) | RESPIRATORY_TRACT | Status: DC | PRN

## 2021-07-06 MED ORDER — FERROUS SULFATE 325 (65 FE) MG PO TABS
325.0000 mg | ORAL_TABLET | Freq: Three times a day (TID) | ORAL | Status: DC
  Administered 2021-07-07 (×2): 325 mg via ORAL
  Filled 2021-07-06 (×4): qty 1

## 2021-07-06 MED ORDER — PHENYLEPHRINE HCL (PRESSORS) 10 MG/ML IV SOLN
INTRAVENOUS | Status: DC | PRN
  Administered 2021-07-06: 80 ug via INTRAVENOUS

## 2021-07-06 MED ORDER — OXYCODONE HCL 5 MG PO TABS
10.0000 mg | ORAL_TABLET | ORAL | Status: DC | PRN
  Administered 2021-07-06 – 2021-07-07 (×3): 15 mg via ORAL
  Filled 2021-07-06 (×3): qty 3

## 2021-07-06 MED ORDER — HYDROMORPHONE HCL 2 MG PO TABS: 2.0000 mg | ORAL_TABLET | ORAL | Status: DC | PRN

## 2021-07-06 MED ORDER — TRAZODONE HCL 50 MG PO TABS
150.0000 mg | ORAL_TABLET | Freq: Every day | ORAL | Status: DC
  Administered 2021-07-06: 150 mg via ORAL
  Filled 2021-07-06: qty 1

## 2021-07-06 MED ORDER — BISACODYL 10 MG RE SUPP: 10.0000 mg | Freq: Every day | RECTAL | Status: DC | PRN

## 2021-07-06 MED ORDER — OXYCODONE HCL 5 MG/5ML PO SOLN: 5.0000 mg | Freq: Once | ORAL | Status: DC | PRN

## 2021-07-06 MED ORDER — FAMOTIDINE 20 MG PO TABS
20.0000 mg | ORAL_TABLET | Freq: Every day | ORAL | Status: DC
  Administered 2021-07-06 – 2021-07-07 (×2): 20 mg via ORAL
  Filled 2021-07-06 (×2): qty 1

## 2021-07-06 MED ORDER — METOCLOPRAMIDE HCL 5 MG PO TABS: 5.0000 mg | ORAL_TABLET | Freq: Three times a day (TID) | ORAL | Status: DC | PRN

## 2021-07-06 MED ORDER — ROSUVASTATIN CALCIUM 10 MG PO TABS
10.0000 mg | ORAL_TABLET | Freq: Every day | ORAL | Status: DC
  Administered 2021-07-06: 10 mg via ORAL
  Filled 2021-07-06: qty 1

## 2021-07-06 MED ORDER — AMISULPRIDE (ANTIEMETIC) 5 MG/2ML IV SOLN: 10.0000 mg | Freq: Once | INTRAVENOUS | Status: DC | PRN

## 2021-07-06 MED ORDER — DEXAMETHASONE SODIUM PHOSPHATE 10 MG/ML IJ SOLN
10.0000 mg | Freq: Once | INTRAMUSCULAR | Status: AC
  Administered 2021-07-07: 10 mg via INTRAVENOUS
  Filled 2021-07-06: qty 1

## 2021-07-06 MED ORDER — HYDROMORPHONE HCL 2 MG PO TABS
1.0000 mg | ORAL_TABLET | ORAL | Status: DC | PRN
  Administered 2021-07-07: 2 mg via ORAL
  Filled 2021-07-06: qty 1

## 2021-07-06 MED ORDER — BUPIVACAINE IN DEXTROSE 0.75-8.25 % IT SOLN
INTRATHECAL | Status: DC | PRN
  Administered 2021-07-06: 1.6 mL via INTRATHECAL

## 2021-07-06 MED ORDER — TRANEXAMIC ACID-NACL 1000-0.7 MG/100ML-% IV SOLN
1000.0000 mg | INTRAVENOUS | Status: AC
  Administered 2021-07-06: 1000 mg via INTRAVENOUS
  Filled 2021-07-06: qty 100

## 2021-07-06 MED ORDER — METHOCARBAMOL 500 MG IVPB - SIMPLE MED
500.0000 mg | Freq: Four times a day (QID) | INTRAVENOUS | Status: DC | PRN
  Filled 2021-07-06: qty 50

## 2021-07-06 MED ORDER — ONDANSETRON HCL 4 MG PO TABS: 4.0000 mg | ORAL_TABLET | Freq: Four times a day (QID) | ORAL | Status: DC | PRN

## 2021-07-06 MED ORDER — BUPIVACAINE-EPINEPHRINE (PF) 0.5% -1:200000 IJ SOLN
INTRAMUSCULAR | Status: DC | PRN
  Administered 2021-07-06: 30 mL via PERINEURAL

## 2021-07-06 MED ORDER — SODIUM CHLORIDE (PF) 0.9 % IJ SOLN
INTRAMUSCULAR | Status: DC | PRN
  Administered 2021-07-06: 30 mL

## 2021-07-06 MED ORDER — TRANEXAMIC ACID-NACL 1000-0.7 MG/100ML-% IV SOLN
1000.0000 mg | Freq: Once | INTRAVENOUS | Status: AC
  Administered 2021-07-06: 1000 mg via INTRAVENOUS
  Filled 2021-07-06: qty 100

## 2021-07-06 MED ORDER — DEXAMETHASONE SODIUM PHOSPHATE 10 MG/ML IJ SOLN
8.0000 mg | Freq: Once | INTRAMUSCULAR | Status: AC
  Administered 2021-07-06: 10 mg via INTRAVENOUS

## 2021-07-06 MED ORDER — ACETAMINOPHEN 325 MG PO TABS: 325.0000 mg | ORAL_TABLET | Freq: Four times a day (QID) | ORAL | Status: DC | PRN

## 2021-07-06 MED ORDER — MENTHOL 3 MG MT LOZG: 1.0000 | LOZENGE | OROMUCOSAL | Status: DC | PRN

## 2021-07-06 MED ORDER — CEFAZOLIN SODIUM-DEXTROSE 2-4 GM/100ML-% IV SOLN
2.0000 g | INTRAVENOUS | Status: AC
  Administered 2021-07-06: 2 g via INTRAVENOUS
  Filled 2021-07-06: qty 100

## 2021-07-06 MED ORDER — POLYETHYLENE GLYCOL 3350 17 G PO PACK
17.0000 g | PACK | Freq: Every day | ORAL | Status: DC | PRN
  Administered 2021-07-07: 17 g via ORAL
  Filled 2021-07-06: qty 1

## 2021-07-06 MED ORDER — BUPIVACAINE-EPINEPHRINE (PF) 0.25% -1:200000 IJ SOLN
INTRAMUSCULAR | Status: DC | PRN
  Administered 2021-07-06: 30 mL

## 2021-07-06 MED ORDER — 0.9 % SODIUM CHLORIDE (POUR BTL) OPTIME
TOPICAL | Status: DC | PRN
  Administered 2021-07-06: 1000 mL

## 2021-07-06 MED ORDER — FENTANYL CITRATE PF 50 MCG/ML IJ SOSY
50.0000 ug | PREFILLED_SYRINGE | Freq: Once | INTRAMUSCULAR | Status: DC
  Filled 2021-07-06: qty 2

## 2021-07-06 MED ORDER — ACYCLOVIR 400 MG PO TABS
400.0000 mg | ORAL_TABLET | Freq: Two times a day (BID) | ORAL | Status: DC
  Administered 2021-07-06 – 2021-07-07 (×2): 400 mg via ORAL
  Filled 2021-07-06 (×2): qty 1

## 2021-07-06 MED ORDER — KETOROLAC TROMETHAMINE 30 MG/ML IJ SOLN
INTRAMUSCULAR | Status: DC | PRN
  Administered 2021-07-06: 30 mg

## 2021-07-06 MED ORDER — KETOROLAC TROMETHAMINE 30 MG/ML IJ SOLN
INTRAMUSCULAR | Status: AC
  Filled 2021-07-06: qty 1

## 2021-07-06 MED ORDER — FENTANYL CITRATE PF 50 MCG/ML IJ SOSY
25.0000 ug | PREFILLED_SYRINGE | INTRAMUSCULAR | Status: DC | PRN
  Administered 2021-07-06 (×3): 50 ug via INTRAVENOUS

## 2021-07-06 MED ORDER — STERILE WATER FOR IRRIGATION IR SOLN
Status: DC | PRN
  Administered 2021-07-06: 2000 mL

## 2021-07-06 MED ORDER — DIPHENHYDRAMINE HCL 12.5 MG/5ML PO ELIX
12.5000 mg | ORAL_SOLUTION | ORAL | Status: DC | PRN
  Administered 2021-07-07: 25 mg via ORAL
  Filled 2021-07-06: qty 10

## 2021-07-06 MED ORDER — FENTANYL CITRATE (PF) 100 MCG/2ML IJ SOLN
INTRAMUSCULAR | Status: DC | PRN
  Administered 2021-07-06: 100 ug via INTRAVENOUS

## 2021-07-06 MED ORDER — MIDAZOLAM HCL 2 MG/2ML IJ SOLN
1.0000 mg | Freq: Once | INTRAMUSCULAR | Status: AC
  Administered 2021-07-06: 2 mg via INTRAVENOUS
  Filled 2021-07-06: qty 2

## 2021-07-06 MED ORDER — METHOCARBAMOL 500 MG PO TABS
500.0000 mg | ORAL_TABLET | Freq: Four times a day (QID) | ORAL | Status: DC | PRN
  Administered 2021-07-06 – 2021-07-07 (×3): 500 mg via ORAL
  Filled 2021-07-06 (×3): qty 1

## 2021-07-06 MED ORDER — SODIUM CHLORIDE 0.9 % IR SOLN
Status: DC | PRN
  Administered 2021-07-06: 3000 mL

## 2021-07-06 MED ORDER — PHENOL 1.4 % MT LIQD: 1.0000 | OROMUCOSAL | Status: DC | PRN

## 2021-07-06 MED ORDER — SODIUM CHLORIDE 0.9 % IV SOLN: INTRAVENOUS | Status: DC

## 2021-07-06 MED ORDER — CEFAZOLIN SODIUM-DEXTROSE 2-4 GM/100ML-% IV SOLN
2.0000 g | Freq: Four times a day (QID) | INTRAVENOUS | Status: AC
  Administered 2021-07-06 (×2): 2 g via INTRAVENOUS
  Filled 2021-07-06 (×2): qty 100

## 2021-07-06 MED ORDER — OXYCODONE HCL 5 MG PO TABS
5.0000 mg | ORAL_TABLET | ORAL | Status: DC | PRN
  Administered 2021-07-07 (×3): 10 mg via ORAL
  Filled 2021-07-06 (×3): qty 2

## 2021-07-06 MED ORDER — ASPIRIN 81 MG PO CHEW
81.0000 mg | CHEWABLE_TABLET | Freq: Two times a day (BID) | ORAL | Status: DC
  Administered 2021-07-06 – 2021-07-07 (×2): 81 mg via ORAL
  Filled 2021-07-06 (×2): qty 1

## 2021-07-06 MED ORDER — ONDANSETRON HCL 4 MG/2ML IJ SOLN
INTRAMUSCULAR | Status: DC | PRN
  Administered 2021-07-06: 4 mg via INTRAVENOUS

## 2021-07-06 SURGICAL SUPPLY — 55 items
ADH SKN CLS APL DERMABOND .7 (GAUZE/BANDAGES/DRESSINGS) ×1
ATTUNE PSRP INSR SZ4 10 KNEE (Insert) ×1 IMPLANT
BAG COUNTER SPONGE SURGICOUNT (BAG) ×1 IMPLANT
BAG SPEC THK2 15X12 ZIP CLS (MISCELLANEOUS)
BAG SPNG CNTER NS LX DISP (BAG) ×1
BAG ZIPLOCK 12X15 (MISCELLANEOUS) IMPLANT
BLADE SAW SGTL 11.0X1.19X90.0M (BLADE) IMPLANT
BLADE SAW SGTL 13.0X1.19X90.0M (BLADE) ×3 IMPLANT
BLADE SAW SGTL 81X20 HD (BLADE) ×1 IMPLANT
BLADE SURG SZ10 CARB STEEL (BLADE) ×6 IMPLANT
BNDG ELASTIC 6X5.8 VLCR STR LF (GAUZE/BANDAGES/DRESSINGS) ×3 IMPLANT
BOWL SMART MIX CTS (DISPOSABLE) ×3 IMPLANT
BSPLAT TIB 4 CMNT REV ROT PLAT (Knees) ×1 IMPLANT
CUFF TOURN SGL QUICK 34 (TOURNIQUET CUFF) ×2
CUFF TRNQT CYL 34X4.125X (TOURNIQUET CUFF) ×2 IMPLANT
DERMABOND ADVANCED (GAUZE/BANDAGES/DRESSINGS) ×1
DERMABOND ADVANCED .7 DNX12 (GAUZE/BANDAGES/DRESSINGS) ×2 IMPLANT
DRAPE INCISE IOBAN 66X45 STRL (DRAPES) ×2 IMPLANT
DRAPE U-SHAPE 47X51 STRL (DRAPES) ×3 IMPLANT
DRESSING AQUACEL AG SP 3.5X10 (GAUZE/BANDAGES/DRESSINGS) ×2 IMPLANT
DRSG AQUACEL AG ADV 3.5X14 (GAUZE/BANDAGES/DRESSINGS) ×1 IMPLANT
DRSG AQUACEL AG SP 3.5X10 (GAUZE/BANDAGES/DRESSINGS) ×2
DURAPREP 26ML APPLICATOR (WOUND CARE) ×6 IMPLANT
ELECT REM PT RETURN 15FT ADLT (MISCELLANEOUS) ×3 IMPLANT
GLOVE SURG ENC MOIS LTX SZ6 (GLOVE) ×3 IMPLANT
GLOVE SURG ENC MOIS LTX SZ7 (GLOVE) ×3 IMPLANT
GLOVE SURG UNDER LTX SZ6.5 (GLOVE) ×3 IMPLANT
GLOVE SURG UNDER POLY LF SZ7.5 (GLOVE) ×3 IMPLANT
GOWN STRL REUS W/ TWL LRG LVL3 (GOWN DISPOSABLE) ×2 IMPLANT
GOWN STRL REUS W/TWL LRG LVL3 (GOWN DISPOSABLE) ×2
HANDPIECE INTERPULSE COAX TIP (DISPOSABLE) ×2
HOLDER FOLEY CATH W/STRAP (MISCELLANEOUS) ×1 IMPLANT
INSERT TIB CMT ATTUNE RP SZ4 (Knees) ×1 IMPLANT
KIT TURNOVER KIT A (KITS) IMPLANT
MANIFOLD NEPTUNE II (INSTRUMENTS) ×3 IMPLANT
NDL SAFETY ECLIPSE 18X1.5 (NEEDLE) IMPLANT
NEEDLE HYPO 18GX1.5 SHARP (NEEDLE) ×2
NS IRRIG 1000ML POUR BTL (IV SOLUTION) ×3 IMPLANT
PACK TOTAL KNEE CUSTOM (KITS) ×3 IMPLANT
PROTECTOR NERVE ULNAR (MISCELLANEOUS) ×3 IMPLANT
SET HNDPC FAN SPRY TIP SCT (DISPOSABLE) ×2 IMPLANT
SET PAD KNEE POSITIONER (MISCELLANEOUS) ×3 IMPLANT
SLEEVE TIB ATTUNE FP 45 (Knees) ×1 IMPLANT
SPIKE FLUID TRANSFER (MISCELLANEOUS) ×6 IMPLANT
STEM STR ATTUNE PF 16X60 (Knees) ×1 IMPLANT
SUT MNCRL AB 4-0 PS2 18 (SUTURE) ×3 IMPLANT
SUT STRATAFIX PDS+ 0 24IN (SUTURE) ×3 IMPLANT
SUT VIC AB 1 CT1 36 (SUTURE) ×3 IMPLANT
SUT VIC AB 2-0 CT1 27 (SUTURE) ×4
SUT VIC AB 2-0 CT1 TAPERPNT 27 (SUTURE) ×4 IMPLANT
SYR 3ML LL SCALE MARK (SYRINGE) ×3 IMPLANT
TRAY FOLEY MTR SLVR 16FR STAT (SET/KITS/TRAYS/PACK) ×3 IMPLANT
TUBE SUCTION HIGH CAP CLEAR NV (SUCTIONS) ×3 IMPLANT
WATER STERILE IRR 1000ML POUR (IV SOLUTION) ×6 IMPLANT
WRAP KNEE MAXI GEL POST OP (GAUZE/BANDAGES/DRESSINGS) ×3 IMPLANT

## 2021-07-06 NOTE — Anesthesia Preprocedure Evaluation (Signed)
Anesthesia Evaluation  ?Patient identified by MRN, date of birth, ID band ?Patient awake ? ? ? ?Reviewed: ?Allergy & Precautions, NPO status , Patient's Chart, lab work & pertinent test results ? ?Airway ?Mallampati: II ? ?TM Distance: >3 FB ?Neck ROM: Full ? ? ? Dental ?no notable dental hx. ? ?  ?Pulmonary ?asthma ,  ?  ?Pulmonary exam normal ? ? ? ? ? ? ? Cardiovascular ?hypertension, Pt. on medications and Pt. on home beta blockers ?Normal cardiovascular exam ? ? ?  ?Neuro/Psych ?Anxiety negative neurological ROS ?   ? GI/Hepatic ?negative GI ROS, Neg liver ROS,   ?Endo/Other  ?negative endocrine ROS ? Renal/GU ?negative Renal ROS  ? ?  ?Musculoskeletal ? ?(+) Arthritis ,  ? Abdominal ?  ?Peds ? Hematology ?negative hematology ROS ?(+)   ?Anesthesia Other Findings ?Painful left total knee arthroplasty, possible loosening ? Reproductive/Obstetrics ? ?  ? ? ? ? ? ? ? ? ? ? ? ? ? ?  ?  ? ? ? ? ? ? ? ? ?Anesthesia Physical ?Anesthesia Plan ? ?ASA: 2 ? ?Anesthesia Plan: Spinal and Regional  ? ?Post-op Pain Management:   ? ?Induction: Intravenous ? ?PONV Risk Score and Plan: 2 and Ondansetron, Dexamethasone, Propofol infusion, Midazolam and Treatment may vary due to age or medical condition ? ?Airway Management Planned: Simple Face Mask ? ?Additional Equipment:  ? ?Intra-op Plan:  ? ?Post-operative Plan:  ? ?Informed Consent: I have reviewed the patients History and Physical, chart, labs and discussed the procedure including the risks, benefits and alternatives for the proposed anesthesia with the patient or authorized representative who has indicated his/her understanding and acceptance.  ? ? ? ?Dental advisory given ? ?Plan Discussed with: CRNA ? ?Anesthesia Plan Comments:   ? ? ? ? ? ? ?Anesthesia Quick Evaluation ? ?

## 2021-07-06 NOTE — H&P (Signed)
TOTAL KNEE REVISION ADMISSION H&P ? ?Patient is being admitted for left revision total knee arthroplasty. ? ?Subjective: ? ?Chief Complaint:left knee pain, failed total knee ? ?HPI: Natasha Hunter, 66 y.o. female, has a history of left primary total knee arthroplasty in 2016 by Dr. Charlann Boxer. She began having pain and stiffness in the knee in 2023 without injury. Workup suggested tibial loosening, and revision total knee arthroplasty was discussed which she did wish to proceed with.  ? ?Patient Active Problem List  ? Diagnosis Date Noted  ? Abnormal findings on diagnostic imaging of liver and biliary tract 08/10/2020  ? Anxiety 08/10/2020  ? Asthma 08/10/2020  ? Atrophic vaginitis 08/10/2020  ? Cholangiectasis 08/10/2020  ? Decreased estrogen level 08/10/2020  ? Gallstones 08/10/2020  ? Genital herpes simplex 08/10/2020  ? Hardening of the aorta (main artery of the heart) (HCC) 08/10/2020  ? Herpetic vulvovaginitis 08/10/2020  ? Hypertriglyceridemia 08/10/2020  ? Insomnia 08/10/2020  ? Mild intermittent asthma 08/10/2020  ? Obesity 08/10/2020  ? Overactive bladder 08/10/2020  ? Peripheral venous insufficiency 08/10/2020  ? Postmenopausal bleeding 08/10/2020  ? Sleep disturbance 08/10/2020  ? Slow transit constipation 08/10/2020  ? Greater trochanteric bursitis of left hip 07/03/2019  ? Left knee pain 04/13/2019  ? Synovitis of left knee 04/13/2019  ? Closed dislocation of hip, right, initial encounter (HCC) 10/07/2018  ? Other mechanical complication of prosthetic joint implant 06/09/2018  ? Multiple thyroid nodules 05/02/2018  ? Essential hypertension 04/16/2018  ? Leg length discrepancy 04/16/2018  ? History of total right hip arthroplasty 03/27/2018  ? S/P left TKA 05/17/2014  ? S/P knee replacement 05/17/2014  ? ?Past Medical History:  ?Diagnosis Date  ? Arthritis   ? Asthma   ? INFFREQUENT PROBLEMS - DOES NOT HAVE INHALER  ? Complication of anesthesia   ? Difficulty sleeping   ? Gall stones   ? Genital herpes   ?  GERD (gastroesophageal reflux disease)   ? History of transfusion   ? Hypertension   ? PONV (postoperative nausea and vomiting)   ?  ?Past Surgical History:  ?Procedure Laterality Date  ? ABDOMINAL HYSTERECTOMY  2001  ? ABORATIONS    ? X2  ? BREAST BIOPSY Right 2020  ? CHOLECYSTECTOMY N/A 09/08/2020  ? Procedure: LAPAROSCOPIC CHOLECYSTECTOMY WITH INTRAOPERATIVE CHOLANGIOGRAM;  Surgeon: Kinsinger, De Blanch, MD;  Location: [facility];  Service: General;  Laterality: N/A;  ? JOINT REPLACEMENT  2007  ? RT KNEE  ? KNEE ARTHROSCOPY  2005  ? rt knee  ? PARTIAL HIP ARTHROPLASTY Right   ? TOTAL KNEE ARTHROPLASTY Left 05/17/2014  ? Procedure: LEFT TOTAL KNEE ARTHROPLASTY;  Surgeon: Shelda Pal, MD;  Location: [facility];  Service: Orthopedics;  Laterality: Left;  ?  ?Current Facility-Administered Medications  ?Medication Dose Route Frequency Provider Last Rate Last Admin  ? ceFAZolin (ANCEF) IVPB 2g/100 mL premix  2 g Intravenous On Call to OR Cassandria Anger, PA-C      ? dexamethasone (DECADRON) injection 8 mg  8 mg Intravenous Once Cassandria Anger, PA-C      ? fentaNYL (SUBLIMAZE) injection 50-100 mcg  50-100 mcg Intravenous Once Ellender, Catheryn Bacon, MD      ? lactated ringers infusion   Intravenous Continuous Cassandria Anger, PA-C      ? midazolam (VERSED) injection 1-2 mg  1-2 mg Intravenous Once Ellender, Catheryn Bacon, MD      ? povidone-iodine 10 % swab 2 application.  2 application. Topical Once Rosalene Billings  R, PA-C      ? tranexamic acid (CYKLOKAPRON) IVPB 1,000 mg  1,000 mg Intravenous To OR Cassandria Anger, PA-C      ? ?Allergies  ?Allergen Reactions  ? Darvon [Propoxyphene] Other (See Comments)  ?  Hallucinations  ? Sulfa Antibiotics Other (See Comments)  ?  Large welps  ? Broccoli [Brassica Oleracea] Rash  ? Cabbage Rash  ?  ?Social History  ? ?Tobacco Use  ? Smoking status: Never  ? Smokeless tobacco: Never  ?Substance Use Topics  ? Alcohol use: No  ?  Comment: rare  ?  ?Family History  ?Problem Relation Age of Onset   ? Diabetes Mother   ? High Cholesterol Mother   ? Kidney Stones Mother   ? Breast cancer Maternal Aunt   ?  ? ? Review of Systems  ?Constitutional:  Negative for chills and fever.  ?Respiratory:  Negative for cough and shortness of breath.   ?Cardiovascular:  Negative for chest pain.  ?Gastrointestinal:  Negative for nausea and vomiting.  ?Musculoskeletal:  Positive for arthralgias.   ? ? ?Objective: ? ?Physical Exam ?Well nourished and well developed. ?General: Alert and oriented x3, cooperative and pleasant, no acute distress. ?Head: normocephalic, atraumatic, neck supple. ?Eyes: EOMI. ? ?Musculoskeletal: ?Left knee exam: ?Her incision remains well-healed ?There is no erythema warmth or palpable effusion ?She has near full active and passive extension with flexion over 110 degrees ?Stable medial and lateral collateral ligaments ?No excessive crepitation noted ?Tenderness to palpation around the proximal tibia more so than the distal femur ?No lower extremity edema, erythema or calf tenderness ? ?Calves soft and nontender. Motor function intact in LE. Strength 5/5 LE bilaterally. ?Neuro: Distal pulses 2+. Sensation to light touch intact in LE. ? ?Vital signs in last 24 hours: ?  ? ?Labs: ? ?Estimated body mass index is 26.69 kg/m? as calculated from the following: ?  Height as of 06/23/21: 5\' 10"  (1.778 m). ?  Weight as of 06/23/21: 84.4 kg. ? ?Imaging Review ?Imaging: ?Standing AP and lateral both of her knees were ordered and reviewed. Assessment of the radiographs does not indicate obvious concern for loosening of the cement mantle particularly on the left knee as this is the one of greater concern. The components otherwise appear to be well sized. No signs of abnormal alignment. ? ?Bone scan performed on 04/21/2021 raises concern for increased uptake around the tibial component consistent with possible loosening with a differential 30 that radiology read out the possibility of trauma but no concern for  infection ? ? ? ?Assessment/Plan: ? ? left knee(s) with failed previous arthroplasty.  ? ?The patient history, physical examination, clinical judgment of the provider and imaging studies are consistent with failure of the left knee(s), previous total knee arthroplasty. Revision total knee arthroplasty is deemed medically necessary. The treatment options including medical management, injection therapy, arthroscopy and revision arthroplasty were discussed at length. The risks and benefits of revision total knee arthroplasty were presented and reviewed. The risks due to aseptic loosening, infection, stiffness, patella tracking problems, thromboembolic complications and other imponderables were discussed. The patient acknowledged the explanation, agreed to proceed with the plan and consent was signed. Patient is being admitted for inpatient treatment for surgery, pain control, PT, OT, prophylactic antibiotics, VTE prophylaxis, progressive ambulation and ADL's and discharge planning.The patient is planning to be discharged  home. ? ?Therapy Plans: outpatient therapy at [facility] ?Disposition: Home alone, with kids at home ?Planned DVT Prophylaxis: aspirin 81mg  BID ?DME needed:  walker ?PCP: Dr. Tracie HarrierHagler, ?TXA: IV ?Allergies: Sulfa - hives, darvocet - hallucinations ?Anesthesia Concerns: none ?BMI: 28 ?Last HgbA1c: Not diabetic ? ? ?Other: ?- Oxycodone, robaxin, tylenol ?- Tramadol sent to pharmacy for when she stops diclofenac ? ?Rosalene BillingsAshley Saidah Kempton, PA-C ?Orthopedic Surgery ?[facility] [place] ?[phone] ? ? ? ? ?

## 2021-07-06 NOTE — Brief Op Note (Signed)
07/06/2021 ? ?10:15 AM ? ?PATIENT:  Natasha Hunter  66 y.o. female ? ?PRE-OPERATIVE DIAGNOSIS:  Aseptic loosening of left tibial component ? ?POST-OPERATIVE DIAGNOSIS:   Aseptic loosening of left tibial component ? ?PROCEDURE:  Procedure(s): ?TOTAL KNEE ARTHROPLASTY WITH REVISION of tibial component ? ?SURGEON:  Surgeon(s) and Role: ?   Durene Romans, MD - Primary ? ?PHYSICIAN ASSISTANT: Rosalene Billings, PA-C ? ?ANESTHESIA:   regional and spinal ? ?EBL:  25 mL  ? ?BLOOD ADMINISTERED:none ? ?DRAINS: none  ? ?LOCAL MEDICATIONS USED:  MARCAINE    ? ?SPECIMEN:  No Specimen ? ?DISPOSITION OF SPECIMEN:  N/A ? ?COUNTS:  YES ? ?TOURNIQUET:   ?Total Tourniquet Time Documented: ?Thigh (Left) - 36 minutes ?Total: Thigh (Left) - 36 minutes ? ? ?DICTATION: .Other Dictation: Dictation Number [id number] ? ?PLAN OF CARE: Admit to inpatient  ? ?PATIENT DISPOSITION:  PACU - hemodynamically stable. ?  ?Delay start of Pharmacological VTE agent (>24hrs) due to surgical blood loss or risk of bleeding: no ? ?

## 2021-07-06 NOTE — Plan of Care (Signed)

## 2021-07-06 NOTE — Progress Notes (Signed)
Pt has arrived to 1323 from PACU s/p left knee revision.  Pt is alert and oriented.  Room orientation completed with call bell placed at bedside.  Will continue to monitor pt. ?

## 2021-07-06 NOTE — Discharge Instructions (Signed)
INSTRUCTIONS AFTER JOINT REPLACEMENT  ? ?Remove items at home which could result in a fall. This includes throw rugs or furniture in walking pathways ?ICE to the affected joint every three hours while awake for 30 minutes at a time, for at least the first 3-5 days, and then as needed for pain and swelling.  Continue to use ice for pain and swelling. You may notice swelling that will progress down to the foot and ankle.  This is normal after surgery.  Elevate your leg when you are not up walking on it.   ?Continue to use the breathing machine you got in the hospital (incentive spirometer) which will help keep your temperature down.  It is common for your temperature to cycle up and down following surgery, especially at night when you are not up moving around and exerting yourself.  The breathing machine keeps your lungs expanded and your temperature down. ? ? ?DIET:  As you were doing prior to hospitalization, we recommend a well-balanced diet. ? ?DRESSING / WOUND CARE / SHOWERING ? ?Keep the surgical dressing until follow up.  The dressing is water proof, so you can shower without any extra covering.  IF THE DRESSING FALLS OFF or the wound gets wet inside, change the dressing with sterile gauze.  Please use good hand washing techniques before changing the dressing.  Do not use any lotions or creams on the incision until instructed by your surgeon.   ? ?ACTIVITY ? ?Increase activity slowly as tolerated, but follow the weight bearing instructions below.   ?No driving for 6 weeks or until further direction given by your physician.  You cannot drive while taking narcotics.  ?No lifting or carrying greater than 10 lbs. until further directed by your surgeon. ?Avoid periods of inactivity such as sitting longer than an hour when not asleep. This helps prevent blood clots.  ?You may return to work once you are authorized by your doctor.  ? ? ? ?WEIGHT BEARING  ? ?Partial weight bearing with assist device as directed.     ? ? ?EXERCISES ? ?Results after joint replacement surgery are often greatly improved when you follow the exercise, range of motion and muscle strengthening exercises prescribed by your doctor. Safety measures are also important to protect the joint from further injury. Any time any of these exercises cause you to have increased pain or swelling, decrease what you are doing until you are comfortable again and then slowly increase them. If you have problems or questions, call your caregiver or physical therapist for advice.  ? ?Rehabilitation is important following a joint replacement. After just a few days of immobilization, the muscles of the leg can become weakened and shrink (atrophy).  These exercises are designed to build up the tone and strength of the thigh and leg muscles and to improve motion. Often times heat used for twenty to thirty minutes before working out will loosen up your tissues and help with improving the range of motion but do not use heat for the first two weeks following surgery (sometimes heat can increase post-operative swelling).  ? ?These exercises can be done on a training (exercise) mat, on the floor, on a table or on a bed. Use whatever works the best and is most comfortable for you.    Use music or television while you are exercising so that the exercises are a pleasant break in your day. This will make your life better with the exercises acting as a break in your routine   that you can look forward to.   Perform all exercises about fifteen times, three times per day or as directed.  You should exercise both the operative leg and the other leg as well. ? ?Exercises include: ?  ?Quad Sets - Tighten up the muscle on the front of the thigh (Quad) and hold for 5-10 seconds.   ?Straight Leg Raises - With your knee straight (if you were given a brace, keep it on), lift the leg to 60 degrees, hold for 3 seconds, and slowly lower the leg.  Perform this exercise against resistance later as your  leg gets stronger.  ?Leg Slides: Lying on your back, slowly slide your foot toward your buttocks, bending your knee up off the floor (only go as far as is comfortable). Then slowly slide your foot back down until your leg is flat on the floor again.  ?Angel Wings: Lying on your back spread your legs to the side as far apart as you can without causing discomfort.  ?Hamstring Strength:  Lying on your back, push your heel against the floor with your leg straight by tightening up the muscles of your buttocks.  Repeat, but this time bend your knee to a comfortable angle, and push your heel against the floor.  You may put a pillow under the heel to make it more comfortable if necessary.  ? ?A rehabilitation program following joint replacement surgery can speed recovery and prevent re-injury in the future due to weakened muscles. Contact your doctor or a physical therapist for more information on knee rehabilitation.  ? ? ?CONSTIPATION ? ?Constipation is defined medically as fewer than three stools per week and severe constipation as less than one stool per week.  Even if you have a regular bowel pattern at home, your normal regimen is likely to be disrupted due to multiple reasons following surgery.  Combination of anesthesia, postoperative narcotics, change in appetite and fluid intake all can affect your bowels.  ? ?YOU MUST use at least one of the following options; they are listed in order of increasing strength to get the job done.  They are all available over the counter, and you may need to use some, POSSIBLY even all of these options:   ? ?Drink plenty of fluids (prune juice may be helpful) and high fiber foods ?Colace 100 mg by mouth twice a day  ?Senokot for constipation as directed and as needed Dulcolax (bisacodyl), take with full glass of water  ?Miralax (polyethylene glycol) once or twice a day as needed. ? ?If you have tried all these things and are unable to have a bowel movement in the first 3-4 days  after surgery call either your surgeon or your primary doctor.   ? ?If you experience loose stools or diarrhea, hold the medications until you stool forms back up.  If your symptoms do not get better within 1 week or if they get worse, check with your doctor.  If you experience "the worst abdominal pain ever" or develop nausea or vomiting, please contact the office immediately for further recommendations for treatment. ? ? ?ITCHING:  If you experience itching with your medications, try taking only a single pain pill, or even half a pain pill at a time.  You can also use Benadryl over the counter for itching or also to help with sleep.  ? ?TED HOSE STOCKINGS:  Use stockings on both legs until for at least 2 weeks or as directed by physician office. They may be removed   at night for sleeping. ? ?MEDICATIONS:  See your medication summary on the ?After Visit Summary? that nursing will review with you.  You may have some home medications which will be placed on hold until you complete the course of blood thinner medication.  It is important for you to complete the blood thinner medication as prescribed. ? ?PRECAUTIONS:  If you experience chest pain or shortness of breath - call 911 immediately for transfer to the hospital emergency department.  ? ?If you develop a fever greater that 101 F, purulent drainage from wound, increased redness or drainage from wound, foul odor from the wound/dressing, or calf pain - CONTACT YOUR SURGEON.   ?                                                ?FOLLOW-UP APPOINTMENTS:  If you do not already have a post-op appointment, please call the office for an appointment to be seen by your surgeon.  Guidelines for how soon to be seen are listed in your ?After Visit Summary?, but are typically between 1-4 weeks after surgery. ? ?OTHER INSTRUCTIONS:  ? ?Knee Replacement:  Do not place pillow under knee, focus on keeping the knee straight while resting. CPM instructions: 0-90 degrees, 2 hours in the  morning, 2 hours in the afternoon, and 2 hours in the evening. Place foam block, curve side up under heel at all times except when in CPM or when walking.  DO NOT modify, tear, cut, or change the foam block

## 2021-07-06 NOTE — Progress Notes (Signed)
AssistedDr. Ellender with right, adductor canal, ultrasound guided block. Side rails up, monitors on throughout procedure. See vital signs in flow sheet. Tolerated Procedure well.  

## 2021-07-06 NOTE — Transfer of Care (Signed)
Immediate Anesthesia Transfer of Care Note ? ?Patient: Natasha Hunter ? ?Procedure(s) Performed: TOTAL KNEE ARTHROPLASTY WITH REVISION COMPONENTS (TIBIAL COMPONENT) (Left: Knee) ? ?Patient Location: PACU ? ?Anesthesia Type:Spinal ? ?Level of Consciousness: awake, alert  and oriented ? ?Airway & Oxygen Therapy: Patient Spontanous Breathing and Patient connected to face mask oxygen ? ?Post-op Assessment: Report given to RN and Post -op Vital signs reviewed and stable ? ?Post vital signs: Reviewed and stable ? ?Last Vitals:  ?Vitals Value Taken Time  ?BP 116/76 07/06/21 1030  ?Temp    ?Pulse 82 07/06/21 1032  ?Resp 18 07/06/21 1032  ?SpO2 100 % 07/06/21 1032  ?Vitals shown include unvalidated device data. ? ?Last Pain:  ?Vitals:  ? 07/06/21 0755  ?TempSrc:   ?PainSc: 0-No pain  ?   ? ?  ? ?Complications: No notable events documented. ?

## 2021-07-06 NOTE — Progress Notes (Signed)
PT Cancellation Note ? ?Patient Details ?Name: Natasha Hunter ?MRN: [id number] ?DOB: 06-20-1955 ? ? ?Cancelled Treatment:    Reason Eval/Treat Not Completed: Pain limiting ability to participate. Pt recently vomited and was given Zofan, RN requesting we hold until pt has eaten. We will check back later as schedule and pt status allows.  ? ? ? ?[name], PT, DPT ?[facility] ?Office: [phone] ?Pager: [phone] ?[name] ?07/06/2021, 2:04 PM ?

## 2021-07-06 NOTE — Anesthesia Procedure Notes (Signed)
Anesthesia Regional Block: Adductor canal block  ? ?Pre-Anesthetic Checklist: , timeout performed,  Correct Patient, Correct Site, Correct Laterality,  Correct Procedure,, site marked,  Risks and benefits discussed,  Surgical consent,  Pre-op evaluation,  At surgeon's request and post-op pain management ? ?Laterality: Left ? ?Prep: chloraprep     ?  ?Needles:  ?Injection technique: Single-shot ? ?Needle Type: Echogenic Stimulator Needle   ? ? ?Needle Length: 9cm  ?Needle Gauge: 21  ? ? ? ?Additional Needles: ? ? ?Procedures:,,,, ultrasound used (permanent image in chart),,    ?Narrative:  ?Start time: 07/06/2021 7:50 AM ?End time: 07/06/2021 8:00 AM ?Injection made incrementally with aspirations every 5 mL. ? ?Performed by: Personally  ?Anesthesiologist: Leonides Grills, MD ? ?Additional Notes: ?Functioning IV was confirmed and monitors were applied. A time-out was performed. Hand hygiene and sterile gloves were used. The thigh was placed in a frog-leg position and prepped in a sterile fashion. A 30mm 21ga Arrow echogenic stimulator needle was placed using ultrasound guidance.  Negative aspiration and negative test dose prior to incremental administration of local anesthetic. The patient tolerated the procedure well. ? ? ? ? ? ?

## 2021-07-06 NOTE — Interval H&P Note (Signed)
History and Physical Interval Note: ? ?07/06/2021 ?7:06 AM ? ?Natasha Hunter  has presented today for surgery, with the diagnosis of Painful left total knee arthroplasty, possible loosening.  The various methods of treatment have been discussed with the patient and family. After consideration of risks, benefits and other options for treatment, the patient has consented to  Procedure(s): ?TOTAL KNEE ARTHROPLASTY WITH REVISION COMPONENTS (TIBIAL COMPONENT) (Left) as a surgical intervention.  The patient's history has been reviewed, patient examined, no change in status, stable for surgery.  I have reviewed the patient's chart and labs.  Questions were answered to the patient's satisfaction.   ? ? ?Mauri Pole ? ? ?

## 2021-07-06 NOTE — Anesthesia Procedure Notes (Signed)
Spinal ? ?Patient location during procedure: OR ?Start time: 07/06/2021 8:50 AM ?End time: 07/06/2021 8:55 AM ?Reason for block: surgical anesthesia ?Staffing ?Performed: anesthesiologist  ?Anesthesiologist: Leonides Grills, MD ?Preanesthetic Checklist ?Completed: patient identified, IV checked, risks and benefits discussed, surgical consent, monitors and equipment checked, pre-op evaluation and timeout performed ?Spinal Block ?Patient position: sitting ?Prep: DuraPrep ?Patient monitoring: cardiac monitor, continuous pulse ox and blood pressure ?Approach: left paramedian ?Location: L4-5 ?Injection technique: single-shot ?Needle ?Needle type: Pencan  ?Needle gauge: 24 G ?Needle length: 9 cm ?Assessment ?Sensory level: T10 ?Events: CSF return ?Additional Notes ?Functioning IV was confirmed and monitors were applied. Sterile prep and drape, including hand hygiene and sterile gloves were used. The patient was positioned and the spine was prepped. The skin was anesthetized with lidocaine.  Free flow of clear CSF was obtained after multiple attempts prior to injecting local anesthetic into the CSF.  The spinal needle aspirated freely following injection.  The needle was carefully withdrawn.  The patient tolerated the procedure well.  ? ? ? ?

## 2021-07-07 ENCOUNTER — Other Ambulatory Visit (HOSPITAL_COMMUNITY): Payer: Self-pay

## 2021-07-07 ENCOUNTER — Encounter (HOSPITAL_COMMUNITY): Payer: Self-pay | Admitting: Orthopedic Surgery

## 2021-07-07 LAB — CBC
HCT: 40 % (ref 36.0–46.0)
Hemoglobin: 13.7 g/dL (ref 12.0–15.0)
MCH: 33.1 pg (ref 26.0–34.0)
MCHC: 34.3 g/dL (ref 30.0–36.0)
MCV: 96.6 fL (ref 80.0–100.0)
Platelets: 218 10*3/uL (ref 150–400)
RBC: 4.14 MIL/uL (ref 3.87–5.11)
RDW: 12.7 % (ref 11.5–15.5)
WBC: 16.9 10*3/uL — ABNORMAL HIGH (ref 4.0–10.5)
nRBC: 0 % (ref 0.0–0.2)

## 2021-07-07 LAB — BASIC METABOLIC PANEL
Anion gap: 9 (ref 5–15)
BUN: 10 mg/dL (ref 8–23)
CO2: 24 mmol/L (ref 22–32)
Calcium: 9.3 mg/dL (ref 8.9–10.3)
Chloride: 102 mmol/L (ref 98–111)
Creatinine, Ser: 0.82 mg/dL (ref 0.44–1.00)
GFR, Estimated: >60 mL/min (ref >60)
Glucose, Bld: 155 mg/dL — ABNORMAL HIGH (ref 70–99)
Potassium: 4.8 mmol/L (ref 3.5–5.1)
Sodium: 135 mmol/L (ref 135–145)

## 2021-07-07 MED ORDER — DOCUSATE SODIUM 100 MG PO CAPS
100.0000 mg | ORAL_CAPSULE | Freq: Two times a day (BID) | ORAL | 0 refills | Status: AC
Start: 2021-07-07 — End: ?
  Filled 2021-07-07: qty 10, 5d supply, fill #0

## 2021-07-07 MED ORDER — OXYCODONE HCL 5 MG PO TABS
5.0000 mg | ORAL_TABLET | ORAL | 0 refills | Status: AC | PRN
  Filled 2021-07-07: qty 42, 4d supply, fill #0

## 2021-07-07 MED ORDER — METHOCARBAMOL 500 MG PO TABS: 500.0000 mg | ORAL_TABLET | Freq: Four times a day (QID) | ORAL | 0 refills | Status: DC | PRN

## 2021-07-07 MED ORDER — POLYETHYLENE GLYCOL 3350 17 G PO PACK: 17.0000 g | PACK | Freq: Every day | ORAL | 0 refills | Status: DC | PRN

## 2021-07-07 MED ORDER — ASPIRIN 81 MG PO CHEW
81.0000 mg | CHEWABLE_TABLET | Freq: Two times a day (BID) | ORAL | 0 refills | Status: AC
  Filled 2021-07-07: qty 56, 28d supply, fill #0

## 2021-07-07 MED ORDER — METHOCARBAMOL 500 MG PO TABS
500.0000 mg | ORAL_TABLET | Freq: Four times a day (QID) | ORAL | 0 refills | Status: AC | PRN
  Filled 2021-07-07: qty 40, 10d supply, fill #0

## 2021-07-07 MED ORDER — ACETAMINOPHEN 325 MG PO TABS
1000.0000 mg | ORAL_TABLET | Freq: Four times a day (QID) | ORAL | Status: DC
Start: 2021-07-07 — End: 2021-07-07

## 2021-07-07 MED ORDER — ACETAMINOPHEN 325 MG PO TABS: 1000.0000 mg | ORAL_TABLET | Freq: Four times a day (QID) | ORAL | Status: AC

## 2021-07-07 MED ORDER — DOCUSATE SODIUM 100 MG PO CAPS: 100.0000 mg | ORAL_CAPSULE | Freq: Two times a day (BID) | ORAL | 0 refills | Status: DC

## 2021-07-07 MED ORDER — NALOXONE HCL 4 MG/0.1ML NA LIQD
NASAL | 0 refills | Status: AC
  Filled 2021-07-07: qty 2, 2d supply, fill #0

## 2021-07-07 MED ORDER — OXYCODONE HCL 10 MG PO TABS: 10.0000 mg | ORAL_TABLET | ORAL | 0 refills | Status: DC | PRN

## 2021-07-07 MED ORDER — POLYETHYLENE GLYCOL 3350 17 G PO PACK
17.0000 g | PACK | Freq: Every day | ORAL | 0 refills | Status: AC | PRN
  Filled 2021-07-07: qty 14, 14d supply, fill #0

## 2021-07-07 MED ORDER — ASPIRIN 81 MG PO CHEW
81.0000 mg | CHEWABLE_TABLET | Freq: Two times a day (BID) | ORAL | 0 refills | Status: DC
Start: 2021-07-07 — End: 2021-07-07

## 2021-07-07 NOTE — Anesthesia Postprocedure Evaluation (Signed)
Anesthesia Post Note ? ?Patient: KAILEIA FLOW ? ?Procedure(s) Performed: TOTAL KNEE ARTHROPLASTY WITH REVISION COMPONENTS (TIBIAL COMPONENT) (Left: Knee) ? ?  ? ?Patient location during evaluation: PACU ?Anesthesia Type: Regional and Spinal ?Level of consciousness: awake ?Pain management: pain level controlled ?Vital Signs Assessment: post-procedure vital signs reviewed and stable ?Respiratory status: spontaneous breathing, nonlabored ventilation, respiratory function stable and patient connected to nasal cannula oxygen ?Cardiovascular status: stable and blood pressure returned to baseline ?Postop Assessment: no apparent nausea or vomiting ?Anesthetic complications: no ? ? ?No notable events documented. ? ?Last Vitals:  ?Vitals:  ? 07/07/21 0832 07/07/21 1304  ?BP: 137/77 133/81  ?Pulse: 76 70  ?Resp:  18  ?Temp:  36.8 ?C  ?SpO2:  100%  ?  ?Last Pain:  ?Vitals:  ? 07/07/21 1311  ?TempSrc:   ?PainSc: 2   ? ? ?  ?  ?  ?  ?  ?  ? ?Lynnmarie Lovett P Aveyah Greenwood ? ? ? ? ?

## 2021-07-07 NOTE — Plan of Care (Signed)
Pt ready to DC home with family. 

## 2021-07-07 NOTE — Progress Notes (Signed)
Physical Therapy Treatment ?Patient Details ?Name: Natasha Hunter ?MRN: [id number] ?DOB: Jul 17, 1955 ?Today's Date: 07/07/2021 ? ? ?History of Present Illness Pt is a 66yo female presenting s/p L-TKA with revision of tibial components from 2016 L-TKA. PMH: anxiety, OA, GERD, HTN, R-partial hip, R knee replacement 2007 ? ?  ?PT Comments  ? ? Patient has met PT goals for DC ?   ?Recommendations for follow up therapy are one component of a multi-disciplinary discharge planning process, led by the attending physician.  Recommendations may be updated based on patient status, additional functional criteria and insurance authorization. ? ?Follow Up Recommendations ? Follow physician's recommendations for discharge plan and follow up therapies ?  ?  ?Assistance Recommended at Discharge PRN  ?Patient can return home with the following Assist for transportation;Assistance with cooking/housework;Help with stairs or ramp for entrance ?  ?Equipment Recommendations ? Rolling walker (2 wheels)  ?  ?Recommendations for Other Services   ? ? ?  ?Precautions / Restrictions Precautions ?Precautions: Knee;Fall ?Restrictions ?LLE Weight Bearing: Partial weight bearing ?LLE Partial Weight Bearing Percentage or Pounds: 50  ?  ? ?Mobility ? Bed Mobility ?Overal bed mobility: Modified Independent ?  ?  ?  ?  ?  ?  ?General bed mobility comments: use of belt on the foot ?  ? ?Transfers ?Overall transfer level: Modified independent ?Equipment used: Rolling walker (2 wheels) ?Transfers: Sit to/from Stand ?Sit to Stand: Min guard ?  ?  ?  ?  ?  ?General transfer comment: cues for hand  and lft leg position ?  ? ?Ambulation/Gait ?Ambulation/Gait assistance: Supervision ?Gait Distance (Feet): 100 Feet ?Assistive device: Rolling walker (2 wheels) ?Gait Pattern/deviations: Step-through pattern ?  ?  ?  ?General Gait Details: cues for sequence and PWB ? ? ?Stairs ?  ?  ?  ?  ?  ? ? ?Wheelchair Mobility ?  ? ?Modified Rankin (Stroke Patients Only) ?   ? ? ?  ?Balance Overall balance assessment: No apparent balance deficits (not formally assessed) ?  ?  ?  ?  ?  ?  ?  ?  ?  ?  ?  ?  ?  ?  ?  ?  ?  ?  ?  ? ?  ?Cognition Arousal/Alertness: Awake/alert ?Behavior During Therapy: [facility] for tasks assessed/performed ?Overall Cognitive Status: Within Functional Limits for tasks assessed ?  ?  ?  ?  ?  ?  ?  ?  ?  ?  ?  ?  ?  ?  ?  ?  ?  ?  ?  ? ?  ?Exercises  ? ?  ?General Comments General comments (skin integrity, edema, etc.): with RW ?  ?  ? ?Pertinent Vitals/Pain Pain Assessment ?Pain Assessment: 0-10 ?Pain Score: 2  ?Pain Location: left knee ?Pain Descriptors / Indicators: Sore ?Pain Intervention(s): Monitored during session  ? ? ?Home Living Family/patient expects to be discharged to:: Private residence ?Living Arrangements: Alone;Other relatives ?Available Help at Discharge: Family;Available 24 hours/day ?Type of Home: Apartment ?Home Access: Level entry ?  ?  ?  ?Home Layout: One level ?Home Equipment: Conservation officer, nature (2 wheels) ?   ?  ?Prior Function    ?  ?  ?   ? ?PT Goals (current goals can now be found in the care plan section) Acute Rehab PT Goals ?Patient Stated Goal: go home ?PT Goal Formulation: With patient ?Time For Goal Achievement: 07/21/21 ?Potential to Achieve Goals: Good ?Progress towards PT goals: Progressing  toward goals ? ?  ?Frequency ? ? ? 7X/week ? ? ? ?  ?PT Plan Current plan remains appropriate  ? ? ?Co-evaluation   ?  ?  ?  ?  ? ?  ?AM-PAC PT "6 Clicks" Mobility   ?Outcome Measure ? Help needed turning from your back to your side while in a flat bed without using bedrails?: None ?Help needed moving from lying on your back to sitting on the side of a flat bed without using bedrails?: None ?Help needed moving to and from a bed to a chair (including a wheelchair)?: None ?Help needed standing up from a chair using your arms (e.g., wheelchair or bedside chair)?: None ?Help needed to walk in hospital room?: A Little ?Help needed climbing 3-5 steps  with a railing? : A Little ?6 Click Score: 22 ? ?  ?End of Session Equipment Utilized During Treatment: Gait belt ?Activity Tolerance: Patient tolerated treatment well ?Patient left: in bed;with call bell/phone within reach;with bed alarm set ?Nurse Communication: Mobility status ?PT Visit Diagnosis: Difficulty in walking, not elsewhere classified (R26.2);Pain ?Pain - Right/Left: Left ?Pain - part of body: Knee ?  ? ? ?Time: 8138-8719 ?PT Time Calculation (min) (ACUTE ONLY): 17 min ? ?Charges:  $Gait Training: 8-22 mins ?$         ?          ? ?Tresa Endo PT ?Acute Rehabilitation Services ?Pager [phone] ?Office [phone] ? ? ?Claretha Cooper ?07/07/2021, 4:24 PM ? ?

## 2021-07-07 NOTE — TOC Transition Note (Signed)
Transition of Care (TOC) - CM/SW Discharge Note ? ? ?Patient Details  ?Name: MARLEENA SHUBERT ?MRN: [id number] ?Date of Birth: 1956/01/15 ? ?Transition of Care (TOC) CM/SW Contact:  ?Loreley Schwall, LCSW ?Phone Number: ?07/07/2021, 12:22 PM ? ? ?Clinical Narrative:    ?Met with pt and confirming she has received rw via [place].  Plan for OPPT at [facility].  No further TOC needs. ? ? ?Final next level of care: OP Rehab ?Barriers to Discharge: No Barriers Identified ? ? ?Patient Goals and CMS Choice ?Patient states their goals for this hospitalization and ongoing recovery are:: return home ?  ?  ? ?Discharge Placement ?  ?           ?  ?  ?  ?  ? ?Discharge Plan and Services ?  ?  ?           ?DME Arranged: Walker rolling ?DME Agency: [employer] ?  ?  ?  ?  ?  ?  ?  ?  ? ?Social Determinants of Health (SDOH) Interventions ?  ? ? ?Readmission Risk Interventions ? ?  07/07/2021  ? 12:21 PM  ?Readmission Risk Prevention Plan  ?Post Dischage Appt Complete  ?Medication Screening Complete  ?Transportation Screening Complete  ? ? ? ? ? ?

## 2021-07-07 NOTE — Progress Notes (Signed)
? ?  Subjective: ?1 Day Post-Op Procedure(s) (LRB): ?TOTAL KNEE ARTHROPLASTY WITH REVISION COMPONENTS (TIBIAL COMPONENT) (Left) ?Patient reports pain as mild.   ?Patient seen in rounds by Dr. Charlann Boxer. ?Patient is well, and has had no acute complaints or problems. No acute events overnight. She did have an episode of vomiting related to hydromorphone. Foley catheter removed. Patient was not able to ambulate with PT secondary to nausea. ?We will start therapy today.  ? ?Objective: ?Vital signs in last 24 hours: ?Temp:  [97.3 ?F (36.3 ?C)-98.5 ?F (36.9 ?C)] 98.5 ?F (36.9 ?C) (04/07 0129) ?Pulse Rate:  [63-83] 82 (04/07 0129) ?Resp:  [6-22] 17 (04/07 0129) ?BP: (94-156)/(59-102) 156/95 (04/07 0129) ?SpO2:  [100 %] 100 % (04/07 0129) ?Weight:  [84.4 kg] 84.4 kg (04/06 1346) ? ?Intake/Output from previous day: ? ?Intake/Output Summary (Last 24 hours) at 07/07/2021 0738 ?Last data filed at 07/07/2021 0600 ?Gross per 24 hour  ?Intake 4844.68 ml  ?Output 4975 ml  ?Net -130.32 ml  ?  ? ?Intake/Output this shift: ?No intake/output data recorded. ? ?Labs: ?Recent Labs  ?  07/07/21 ?0306  ?HGB 13.7  ? ?Recent Labs  ?  07/07/21 ?0306  ?WBC 16.9*  ?RBC 4.14  ?HCT 40.0  ?PLT 218  ? ?Recent Labs  ?  07/07/21 ?0306  ?NA 135  ?K 4.8  ?CL 102  ?CO2 24  ?BUN 10  ?CREATININE 0.82  ?GLUCOSE 155*  ?CALCIUM 9.3  ? ?No results for input(s): LABPT, INR in the last 72 hours. ? ?Exam: ?General - Patient is Alert and Oriented ?Extremity - Neurologically intact ?Sensation intact distally ?Intact pulses distally ?Dorsiflexion/Plantar flexion intact ?Dressing - dressing C/D/I ?Motor Function - intact, moving foot and toes well on exam.  ? ?Past Medical History:  ?Diagnosis Date  ? Arthritis   ? Asthma   ? INFFREQUENT PROBLEMS - DOES NOT HAVE INHALER  ? Complication of anesthesia   ? Difficulty sleeping   ? Gall stones   ? Genital herpes   ? GERD (gastroesophageal reflux disease)   ? History of transfusion   ? Hypertension   ? PONV (postoperative nausea and  vomiting)   ? ? ?Assessment/Plan: ?1 Day Post-Op Procedure(s) (LRB): ?TOTAL KNEE ARTHROPLASTY WITH REVISION COMPONENTS (TIBIAL COMPONENT) (Left) ?Principal Problem: ?  S/P revision of total knee, left ? ?Estimated body mass index is 26.69 kg/m? as calculated from the following: ?  Height as of this encounter: 5\' 10"  (1.778 m). ?  Weight as of this encounter: 84.4 kg. ?Advance diet ?Up with therapy ?D/C IV fluids ? ?Anticipated LOS equal to or greater than 2 midnights due to ?- Age 42 and older with one or more of the following: ? - Obesity ? - Expected need for hospital services (PT, OT, Nursing) required for safe  discharge ? - Anticipated need for postoperative skilled nursing care or inpatient rehab ? - Active co-morbidities: None ?OR  ? ?- Unanticipated findings during/Post Surgery: None  ?- Patient is a high risk of re-admission due to: None  ? ?DVT Prophylaxis - Aspirin ?Weight bearing as tolerated. ? ?Hgb stable at 13.7 this AM. ? ? ?Plan is to go Home after hospital stay. Plan for discharge today following 1-2 sessions of PT as long as they are meeting their goals and nausea is improved. Patient is scheduled for OPPT. Follow up in the office in 2 weeks.  ? ?76, PA-C ?Orthopedic Surgery ?([phone]) Dennie Bible ?07/07/2021, 7:38 AM  ?

## 2021-07-07 NOTE — Plan of Care (Signed)
  Problem: Activity: Goal: Ability to avoid complications of mobility impairment will improve Outcome: Progressing   Problem: Clinical Measurements: Goal: Postoperative complications will be avoided or minimized Outcome: Progressing   Problem: Pain Management: Goal: Pain level will decrease with appropriate interventions Outcome: Progressing   

## 2021-07-07 NOTE — Op Note (Signed)
NAME: Natasha Hunter, Natasha Hunter. ?MEDICAL RECORD NO: [id number] ?ACCOUNT NO: [id number] ?DATE OF BIRTH: 08/06/1955 ?FACILITY: [facility] ?LOCATION: [facility] ?PHYSICIAN: Madlyn Frankel. Charlann Boxer, MD ? ?Operative Report  ? ?DATE OF PROCEDURE: 07/06/2021 ? 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 ? 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 ? ?PROCEDURE:  Left total knee revision with revision of the tibial component with Hunter new polyethylene insert. ? ?COMPONENTS USED:  DePuy Attune revision knee system with Hunter size 4 revision tibial tray, Hunter size 45 press-fit sleeve and Hunter size 16 x 60 mm press-fit stem. ? ?SURGEON:  Madlyn Frankel. Charlann Boxer, MD ? ?ASSISTANT:  Rosalene Billings, PA-C.  Note that Ms. Domenic Schwab was present for the entirety of the case from preoperative positioning, perioperative management of the operative extremity, general facilitation of the case and primary wound closure. ? ?ANESTHESIA:  Regional plus spinal block. ? ?BLOOD LOSS:  Less than 50 mL ? ?DRAINS:  None. ? ?COMPLICATIONS:  None. ? ?TOURNIQUET:  Up for 36 minutes at 225 mmHg. ? ?INDICATION FOR PROCEDURE:  The patient is Hunter pleasant 66 year old female with Hunter history of left total knee arthroplasty performed in 2016.  She additionally has had Hunter right knee replaced.  She has been having problems with her left knee over the past 2-3  ?years.  Radiographs have revealed Hunter relatively stable-appearing cement-bone interface; however, she was having persistent pain.  Hunter bone scan had been ordered, which indicated increased uptake around the tibia concerning for loosening.  At this point, we  ?had discussion regarding her pain level, the effect on her quality of life and the radiographic studies.  We discussed proceeding with revision surgery in the setting of aseptic loosening of her tibial component.  Consent was obtained after reviewing  ?risks of  infection, DVT, component failure, need for future surgeries.  We reviewed and discussed the postoperative course and expectations. Consent was obtained for benefit of pain relief. ? ?DESCRIPTION OF PROCEDURE:  The patient was brought to the operative theater.  Once adequate anesthesia and preoperative antibiotics, Ancef administered as well as tranexamic acid and Decadron.  She was positioned supine with Hunter left thigh tourniquet  ?placed.  Left lower extremity was then prepped and draped in sterile fashion.  The left lower extremity was exsanguinated, tourniquet elevated to 225 mmHg.  Hunter timeout was performed identifying the patient, planned procedure, and extremity.  Her old  ?incision was excised.  Soft tissue planes created.  Median arthrotomy was made, encountering clear synovial fluid.  Following initial exposure including synovectomy, I was able to get the tibia subluxated anteriorly.  I then used Hunter thin ACL saw and  ?undermined the cement-bone interface.  I then used an osteotome and did identify that the tibial component debonded from the cement.  The component was removed.  I then placed an extramedullary guide and made Hunter cut just beneath the cement mantle.  I then ? used an osteotome to remove remaining cement.  I began broaching and broached up to Hunter 45 broach.  We did Hunter trial reduction with Hunter 4 tibial tray off of this broach.  I found that with Hunter 10 mm insert, the knee came to full extension and flexion. Note to  ?that I did use Hunter reamer and reamed up to 16 mm for Hunter press-fit stem.  At this point, the final components were opened.  They were configured on  the back table under my direct supervision.  The final tibial press-fit component was then impacted and sat  ?where the level of the broach was.  I did do Hunter repeat trial reduction and did select Hunter 10 mm insert, which was balance from extension into flexion.  The final 10 mm insert to match the size 4 tibia was opened and placed into the tibial tray.  The  knee  ?was irrigated throughout the case and again at this point. We had irrigated the knee with normal saline solution prior to placement of the final components.  I also injected the synovial capsular junction of the knee with 0.25% Marcaine with epinephrine, ? 1 mL of Toradol and saline.  Once the knee implant was in, the tourniquet was let down.  We reirrigated the knee.  The knee was then brought to 60 degrees of flexion and the extensor mechanism was reapproximated using #1 Vicryl and #1 Stratafix suture.  ? The remainder of the wound was closed with 2-0 Vicryl and Hunter running Monocryl stitch.  The knee was clean, dry and dressed sterilely using surgical glue and Aquacel dressing.  The patient was brought to recovery room in stable condition, tolerated the  ?procedure well.  Findings reviewed with family. ? ? ?VAI ?D: 07/06/2021 10:24:39 am T: 07/07/2021 1:44:00 am  ?JOB: [id number]/ [id number]  ?

## 2021-07-07 NOTE — Evaluation (Signed)
Physical Therapy Evaluation ?Patient Details ?Name: Natasha Hunter ?MRN: [id number] ?DOB: November 25, 1955 ?Today's Date: 07/07/2021 ? ?History of Present Illness ? Pt is a 66yo female presenting s/p L-TKA with revision of tibial components from 2016 L-TKA. PMH: anxiety, OA, GERD, HTN, R-partial hip, R knee replacement 2007  ?Clinical Impression ? The patient  ambulated x 100', instructed in PWB on LLE. Patient plans Dc later today. Pt admitted with above diagnosis.  Pt currently with functional limitations due to the deficits listed below (see PT Problem List). Pt will benefit from skilled PT to increase their independence and safety with mobility to allow discharge to the venue listed below.   ?   ?   ? ?Recommendations for follow up therapy are one component of a multi-disciplinary discharge planning process, led by the attending physician.  Recommendations may be updated based on patient status, additional functional criteria and insurance authorization. ? ?Follow Up Recommendations Follow physician's recommendations for discharge plan and follow up therapies ? ?  ?Assistance Recommended at Discharge PRN  ?Patient can return home with the following ? Assist for transportation;Assistance with cooking/housework;Help with stairs or ramp for entrance ? ?  ?Equipment Recommendations Rolling walker (2 wheels)  ?Recommendations for Other Services ?    ?  ?Functional Status Assessment Patient has had a recent decline in their functional status and demonstrates the ability to make significant improvements in function in a reasonable and predictable amount of time.  ? ?  ?Precautions / Restrictions Precautions ?Precautions: Knee;Fall ?Restrictions ?LLE Weight Bearing: Partial weight bearing ?LLE Partial Weight Bearing Percentage or Pounds: 50  ? ?  ? ?Mobility ? Bed Mobility ?Overal bed mobility: Modified Independent ?  ?  ?  ?  ?  ?  ?General bed mobility comments: use of belt on the foot ?  ? ?Transfers ?Overall transfer  level: Needs assistance ?Equipment used: Rolling walker (2 wheels) ?Transfers: Sit to/from Stand ?Sit to Stand: Min guard ?  ?  ?  ?  ?  ?General transfer comment: cues for hand  and lft leg position ?  ? ?Ambulation/Gait ?Ambulation/Gait assistance: Min guard ?Gait Distance (Feet): 100 Feet ?Assistive device: Rolling walker (2 wheels) ?Gait Pattern/deviations: Step-to pattern ?  ?  ?  ?General Gait Details: cues for sequence and PWB ? ?Stairs ?  ?  ?  ?  ?  ? ?Wheelchair Mobility ?  ? ?Modified Rankin (Stroke Patients Only) ?  ? ?  ? ?Balance Overall balance assessment: No apparent balance deficits (not formally assessed) ?  ?  ?  ?  ?  ?  ?  ?  ?  ?  ?  ?  ?  ?  ?  ?  ?  ?  ?   ? ? ? ?Pertinent Vitals/Pain Pain Assessment ?Pain Assessment: 0-10 ?Pain Score: 6  ?Pain Location: left knee ?Pain Descriptors / Indicators: Aching, Discomfort ?Pain Intervention(s): Premedicated before session, Monitored during session, Ice applied  ? ? ?Home Living Family/patient expects to be discharged to:: Private residence ?Living Arrangements: Alone;Other relatives ?Available Help at Discharge: Family;Available 24 hours/day ?Type of Home: Apartment ?Home Access: Level entry ?  ?  ?  ?Home Layout: One level ?Home Equipment: Conservation officer, nature (2 wheels) ?   ?  ?Prior Function Prior Level of Function : Independent/Modified Independent ?  ?  ?  ?  ?  ?  ?  ?  ?  ? ? ?Hand Dominance  ?   ? ?  ?Extremity/Trunk Assessment  ?  Upper Extremity Assessment ?Upper Extremity Assessment: Overall WFL for tasks assessed ?  ? ?Lower Extremity Assessment ?Lower Extremity Assessment: LLE deficits/detail ?LLE Deficits / Details: assisted SLR,, knee flexion to 60* ?  ? ?Cervical / Trunk Assessment ?Cervical / Trunk Assessment: Normal  ?Communication  ? Communication: No difficulties  ?Cognition Arousal/Alertness: Awake/alert ?Behavior During Therapy: [facility] for tasks assessed/performed ?Overall Cognitive Status: Within Functional Limits for tasks assessed ?  ?   ?  ?  ?  ?  ?  ?  ?  ?  ?  ?  ?  ?  ?  ?  ?  ?  ?  ? ?  ?General Comments General comments (skin integrity, edema, etc.): with RW ? ?  ?Exercises Total Joint Exercises ?Ankle Circles/Pumps: AROM, Both, 10 reps, Supine ?Quad Sets: AROM, Both, 10 reps, Supine ?Heel Slides: AAROM, Left, 10 reps, Supine ?Hip ABduction/ADduction: AAROM, Left, 10 reps, Supine ?Straight Leg Raises: AAROM, Left, 10 reps, Supine ?Long Arc Quad: AROM, Left, 10 reps, Seated, AAROM ?Knee Flexion: AROM, Left, 10 reps, Seated, AAROM  ? ?Assessment/Plan  ?  ?PT Assessment Patient needs continued PT services  ?PT Problem List Decreased strength;Decreased mobility;Decreased safety awareness;Decreased range of motion;Decreased knowledge of precautions;Decreased activity tolerance;Decreased knowledge of use of DME;Pain ? ?   ?  ?PT Treatment Interventions DME instruction;Therapeutic activities;Gait training;Therapeutic exercise;Patient/family education;Functional mobility training   ? ?PT Goals (Current goals can be found in the Care Plan section)  ?Acute Rehab PT Goals ?Patient Stated Goal: go home ?PT Goal Formulation: With patient ?Time For Goal Achievement: 07/21/21 ?Potential to Achieve Goals: Good ? ?  ?Frequency 7X/week ?  ? ? ?Co-evaluation   ?  ?  ?  ?  ? ? ?  ?AM-PAC PT "6 Clicks" Mobility  ?Outcome Measure Help needed turning from your back to your side while in a flat bed without using bedrails?: None ?Help needed moving from lying on your back to sitting on the side of a flat bed without using bedrails?: None ?Help needed moving to and from a bed to a chair (including a wheelchair)?: A Little ?Help needed standing up from a chair using your arms (e.g., wheelchair or bedside chair)?: A Little ?  ?Help needed climbing 3-5 steps with a railing? : A Little ?6 Click Score: 17 ? ?  ?End of Session Equipment Utilized During Treatment: Gait belt ?Activity Tolerance: Patient tolerated treatment well ?Patient left: in bed;with call bell/phone  within reach;with bed alarm set ?Nurse Communication: Mobility status ?PT Visit Diagnosis: Difficulty in walking, not elsewhere classified (R26.2);Pain ?Pain - Right/Left: Left ?Pain - part of body: Knee ?  ? ?Time: [phone] ?PT Time Calculation (min) (ACUTE ONLY): 38 min ? ? ?Charges:   PT Evaluation ?$PT Eval Low Complexity: 1 Low ?PT Treatments ?$Gait Training: 8-22 mins ?$Therapeutic Exercise: 8-22 mins ?  ?   ? ? ?Tresa Endo PT ?Acute Rehabilitation Services ?Pager [phone] ?Office [phone] ? ? ?Claretha Cooper ?07/07/2021, 2:00 PM ? ?

## 2021-07-11 DIAGNOSIS — M25662 Stiffness of left knee, not elsewhere classified: Secondary | ICD-10-CM | POA: Diagnosis not present

## 2021-07-11 DIAGNOSIS — M25562 Pain in left knee: Secondary | ICD-10-CM | POA: Diagnosis not present

## 2021-07-13 NOTE — Discharge Summary (Signed)
Patient ID: ?Eustace Quail ?MRN: [id number] ?DOB/AGE: [birth year] 66 y.o. ? ?Admit date: 07/06/2021 ?Discharge date: 07/07/2021 ? ?Admission Diagnoses:  ?Failed left total knee arthroplasty  ? ?Discharge Diagnoses:  ?Principal Problem: ?  S/P revision of total knee, left ? ? ?Past Medical History:  ?Diagnosis Date  ? Arthritis   ? Asthma   ? INFFREQUENT PROBLEMS - DOES NOT HAVE INHALER  ? Complication of anesthesia   ? Difficulty sleeping   ? Gall stones   ? Genital herpes   ? GERD (gastroesophageal reflux disease)   ? History of transfusion   ? Hypertension   ? PONV (postoperative nausea and vomiting)   ? ? ?Surgeries: Procedure(s): ?TOTAL KNEE ARTHROPLASTY WITH REVISION COMPONENTS (TIBIAL COMPONENT) on 07/06/2021 ?  ?Consultants:  ? ?Discharged Condition: Improved ? ?Hospital Course: Natasha Hunter is an 66 y.o. female who was admitted 07/06/2021 for operative treatment ofS/P revision of total knee, left. Patient has severe unremitting pain that affects sleep, daily activities, and work/hobbies. After pre-op clearance the patient was taken to the operating room on 07/06/2021 and underwent  Procedure(s): ?TOTAL KNEE ARTHROPLASTY WITH REVISION COMPONENTS (TIBIAL COMPONENT).   ? ?Patient was given perioperative antibiotics:  ?Anti-infectives (From admission, onward)  ? ? Start     Dose/Rate Route Frequency Ordered Stop  ? 07/06/21 2200  acyclovir (ZOVIRAX) tablet 400 mg  Status:  Discontinued       ? 400 mg Oral 2 times daily 07/06/21 1257 07/07/21 2244  ? 07/06/21 1500  ceFAZolin (ANCEF) IVPB 2g/100 mL premix       ? 2 g ?200 mL/hr over 30 Minutes Intravenous Every 6 hours 07/06/21 1257 07/06/21 2119  ? 07/06/21 0630  ceFAZolin (ANCEF) IVPB 2g/100 mL premix       ? 2 g ?200 mL/hr over 30 Minutes Intravenous On call to O.R. 07/06/21 2563 07/06/21 0902  ? ?  ?  ? ?Patient was given sequential compression devices, early ambulation, and chemoprophylaxis to prevent DVT. Patient worked with PT and was meeting their goals  regarding safe ambulation and transfers. ? ?Patient benefited maximally from hospital stay and there were no complications.   ? ?Recent vital signs: No data found.  ? ?Recent laboratory studies: No results for input(s): WBC, HGB, HCT, PLT, NA, K, CL, CO2, BUN, CREATININE, GLUCOSE, INR, CALCIUM in the last 72 hours. ? ?Invalid input(s): PT, 2 ? ? ?Discharge Medications:   ?Allergies as of 07/07/2021   ? ?   Reactions  ? Darvon [propoxyphene] Other (See Comments)  ? Hallucinations  ? Sulfa Antibiotics Other (See Comments)  ? Large welps  ? Broccoli [brassica Oleracea] Rash  ? Cabbage Rash  ? ?  ? ?  ?Medication List  ?  ? ?STOP taking these medications   ? ?aspirin EC 81 MG tablet ?Replaced by: aspirin 81 MG chewable tablet ?  ?diclofenac 75 MG EC tablet ?Commonly known as: VOLTAREN ?  ?ibuprofen 800 MG tablet ?Commonly known as: ADVIL ?  ?traMADol 50 MG tablet ?Commonly known as: ULTRAM ?  ? ?  ? ?TAKE these medications   ? ?acetaminophen 325 MG tablet ?Commonly known as: TYLENOL ?Take 3 tablets (975 mg total) by mouth every 6 (six) hours. ?  ?acyclovir 400 MG tablet ?Commonly known as: ZOVIRAX ?Take 400 mg by mouth 2 (two) times daily. ?  ?albuterol 108 (90 Base) MCG/ACT inhaler ?Commonly known as: VENTOLIN HFA ?Inhale 1 puff into the lungs every 6 (six) hours as needed for wheezing. ?  ?  amLODipine 10 MG tablet ?Commonly known as: NORVASC ?Take 10 mg by mouth daily. ?  ?aspirin 81 MG chewable tablet ?Chew 1 tablet (81 mg total) by mouth 2 (two) times daily for 28 days. ?Replaces: aspirin EC 81 MG tablet ?  ?atenolol 50 MG tablet ?Commonly known as: TENORMIN ?Take 50 mg by mouth 2 (two) times daily. ?  ?docusate sodium 100 MG capsule ?Commonly known as: COLACE ?Take 1 capsule (100 mg total) by mouth 2 (two) times daily. ?  ?famotidine 20 MG tablet ?Commonly known as: PEPCID ?Take 20 mg by mouth daily. ?  ?IMMUNE SUPPORT PO ?Take 1 tablet by mouth daily. Immune 24 ?  ?methocarbamol 500 MG tablet ?Commonly known as:  ROBAXIN ?Take 1 tablet (500 mg total) by mouth every 6 (six) hours as needed for muscle spasms. ?  ?naloxone 4 MG/0.1ML Liqd nasal spray kit ?Commonly known as: NARCAN ?Spray into one nostril with signs of over-sedation/overdose, then call 911. If no response then may spray into other nostril in 2 minutes ?  ?oxyCODONE 5 MG immediate release tablet ?Commonly known as: Oxy IR/ROXICODONE ?Take 1-2 tablets (5-10 mg total) by mouth every 4 (four) hours as needed for severe pain. ?  ?polyethylene glycol 17 g packet ?Commonly known as: MIRALAX / GLYCOLAX ?Take 17 g by mouth daily as needed for mild constipation. ?  ?rosuvastatin 10 MG tablet ?Commonly known as: CRESTOR ?Take 10 mg by mouth at bedtime. ?  ?traZODone 150 MG tablet ?Commonly known as: DESYREL ?Take 150 mg by mouth at bedtime. ?  ? ?  ? ?  ?  ? ? ?  ?Discharge Care Instructions  ?(From admission, onward)  ?  ? ? ?  ? ?  Start     Ordered  ? 07/07/21 0000  Change dressing       ?Comments: Maintain surgical dressing until follow up in the clinic. If the edges start to pull up, may reinforce with tape. If the dressing is no longer working, may remove and cover with gauze and tape, but must keep the area dry and clean.  Call with any questions or concerns.  ? 07/07/21 0744  ? ?  ?  ? ?  ? ? ?Diagnostic Studies: DG Chest 2 View ? ?Result Date: 06/27/2021 ?CLINICAL DATA:  Preoperative imaging prior to left knee replacement. EXAM: CHEST - 2 VIEW COMPARISON:  November 21, 2018 FINDINGS: The heart size and mediastinal contours are within normal limits. The lungs are mildly hyperinflated. Both lungs are clear. Degenerative changes seen throughout the thoracic spine. IMPRESSION: No active cardiopulmonary disease. Electronically Signed   By: Virgina Norfolk M.D.   On: 06/27/2021 23:26   ? ?Disposition: Discharge disposition: 01-Home or Self Care ? ? ? ? ? ? ?Discharge Instructions   ? ? Call MD / Call 911   Complete by: As directed ?  ? If you experience chest pain or  shortness of breath, CALL 911 and be transported to the hospital emergency room.  If you develope a fever above 101 F, pus (white drainage) or increased drainage or redness at the wound, or calf pain, call your surgeon's office.  ? Change dressing   Complete by: As directed ?  ? Maintain surgical dressing until follow up in the clinic. If the edges start to pull up, may reinforce with tape. If the dressing is no longer working, may remove and cover with gauze and tape, but must keep the area dry and clean.  Call with any  questions or concerns.  ? Constipation Prevention   Complete by: As directed ?  ? Drink plenty of fluids.  Prune juice may be helpful.  You may use a stool softener, such as Colace (over the counter) 100 mg twice a day.  Use MiraLax (over the counter) for constipation as needed.  ? Diet - low sodium heart healthy   Complete by: As directed ?  ? Increase activity slowly as tolerated   Complete by: As directed ?  ? Partial weight bearing with assist device as directed.  ? Post-operative opioid taper instructions:   Complete by: As directed ?  ? POST-OPERATIVE OPIOID TAPER INSTRUCTIONS: ?It is important to wean off of your opioid medication as soon as possible. If you do not need pain medication after your surgery it is ok to stop day one. ?Opioids include: ?Codeine, Hydrocodone(Norco, Vicodin), Oxycodone(Percocet, oxycontin) and hydromorphone amongst others.  ?Long term and even short term use of opiods can cause: ?Increased pain response ?Dependence ?Constipation ?Depression ?Respiratory depression ?And more.  ?Withdrawal symptoms can include ?Flu like symptoms ?Nausea, vomiting ?And more ?Techniques to manage these symptoms ?Hydrate well ?Eat regular healthy meals ?Stay active ?Use relaxation techniques(deep breathing, meditating, yoga) ?Do Not substitute Alcohol to help with tapering ?If you have been on opioids for less than two weeks and do not have pain than it is ok to stop all together.  ?Plan  to wean off of opioids ?This plan should start within one week post op of your joint replacement. ?Maintain the same interval or time between taking each dose and first decrease the dose.  ?Cut the total daily in

## 2021-07-19 DIAGNOSIS — M25562 Pain in left knee: Secondary | ICD-10-CM | POA: Diagnosis not present

## 2021-07-19 DIAGNOSIS — M25662 Stiffness of left knee, not elsewhere classified: Secondary | ICD-10-CM | POA: Diagnosis not present

## 2021-07-26 DIAGNOSIS — M25562 Pain in left knee: Secondary | ICD-10-CM | POA: Diagnosis not present

## 2021-07-26 DIAGNOSIS — M25662 Stiffness of left knee, not elsewhere classified: Secondary | ICD-10-CM | POA: Diagnosis not present

## 2021-08-07 ENCOUNTER — Ambulatory Visit
Admission: RE | Admit: 2021-08-07 | Discharge: 2021-08-07 | Disposition: A | Discharge status: Home / Self Care | Payer: Medicare Other | Source: Ambulatory Visit | Attending: Family Medicine | Admitting: Family Medicine

## 2021-08-07 DIAGNOSIS — Z1231 Encounter for screening mammogram for malignant neoplasm of breast: Secondary | ICD-10-CM | POA: Diagnosis not present

## 2021-08-09 DIAGNOSIS — I10 Essential (primary) hypertension: Secondary | ICD-10-CM | POA: Diagnosis not present

## 2021-08-09 DIAGNOSIS — J4520 Mild intermittent asthma, uncomplicated: Secondary | ICD-10-CM | POA: Diagnosis not present

## 2021-08-09 DIAGNOSIS — G479 Sleep disorder, unspecified: Secondary | ICD-10-CM | POA: Diagnosis not present

## 2021-08-09 DIAGNOSIS — E782 Mixed hyperlipidemia: Secondary | ICD-10-CM | POA: Diagnosis not present

## 2021-08-10 DIAGNOSIS — M25562 Pain in left knee: Secondary | ICD-10-CM | POA: Diagnosis not present

## 2021-08-10 DIAGNOSIS — M25662 Stiffness of left knee, not elsewhere classified: Secondary | ICD-10-CM | POA: Diagnosis not present

## 2021-08-17 DIAGNOSIS — M25662 Stiffness of left knee, not elsewhere classified: Secondary | ICD-10-CM | POA: Diagnosis not present

## 2021-08-17 DIAGNOSIS — M25562 Pain in left knee: Secondary | ICD-10-CM | POA: Diagnosis not present

## 2021-08-19 ENCOUNTER — Emergency Department (HOSPITAL_COMMUNITY): Payer: Medicare Other

## 2021-08-19 ENCOUNTER — Encounter (HOSPITAL_COMMUNITY): Payer: Self-pay | Admitting: Emergency Medicine

## 2021-08-19 ENCOUNTER — Other Ambulatory Visit: Payer: Self-pay

## 2021-08-19 ENCOUNTER — Emergency Department (HOSPITAL_COMMUNITY)
Admission: EM | Admit: 2021-08-19 | Discharge: 2021-08-19 | Disposition: A | Discharge status: Home / Self Care | Payer: Medicare Other | Attending: Emergency Medicine | Admitting: Emergency Medicine

## 2021-08-19 DIAGNOSIS — Z79899 Other long term (current) drug therapy: Secondary | ICD-10-CM | POA: Insufficient documentation

## 2021-08-19 DIAGNOSIS — T84020A Dislocation of internal right hip prosthesis, initial encounter: Secondary | ICD-10-CM | POA: Diagnosis not present

## 2021-08-19 DIAGNOSIS — X58XXXA Exposure to other specified factors, initial encounter: Secondary | ICD-10-CM | POA: Insufficient documentation

## 2021-08-19 DIAGNOSIS — Z96641 Presence of right artificial hip joint: Secondary | ICD-10-CM | POA: Diagnosis not present

## 2021-08-19 DIAGNOSIS — Z471 Aftercare following joint replacement surgery: Secondary | ICD-10-CM | POA: Diagnosis not present

## 2021-08-19 DIAGNOSIS — S838X1A Sprain of other specified parts of right knee, initial encounter: Secondary | ICD-10-CM | POA: Diagnosis not present

## 2021-08-19 DIAGNOSIS — I10 Essential (primary) hypertension: Secondary | ICD-10-CM | POA: Insufficient documentation

## 2021-08-19 DIAGNOSIS — S79911A Unspecified injury of right hip, initial encounter: Secondary | ICD-10-CM | POA: Diagnosis present

## 2021-08-19 DIAGNOSIS — Z87828 Personal history of other (healed) physical injury and trauma: Secondary | ICD-10-CM | POA: Diagnosis not present

## 2021-08-19 DIAGNOSIS — S73004A Unspecified dislocation of right hip, initial encounter: Secondary | ICD-10-CM | POA: Diagnosis not present

## 2021-08-19 LAB — CBC WITH DIFFERENTIAL/PLATELET
Abs Immature Granulocytes: 0.03 10*3/uL (ref 0.00–0.07)
Basophils Absolute: 0.1 10*3/uL (ref 0.0–0.1)
Basophils Relative: 1 %
Eosinophils Absolute: 0.1 10*3/uL (ref 0.0–0.5)
Eosinophils Relative: 1 %
HCT: 44.4 % (ref 36.0–46.0)
Hemoglobin: 15 g/dL (ref 12.0–15.0)
Immature Granulocytes: 0 %
Lymphocytes Relative: 29 %
Lymphs Abs: 3.2 10*3/uL (ref 0.7–4.0)
MCH: 32.2 pg (ref 26.0–34.0)
MCHC: 33.8 g/dL (ref 30.0–36.0)
MCV: 95.3 fL (ref 80.0–100.0)
Monocytes Absolute: 0.9 10*3/uL (ref 0.1–1.0)
Monocytes Relative: 8 %
Neutro Abs: 6.8 10*3/uL (ref 1.7–7.7)
Neutrophils Relative %: 61 %
Platelets: 312 10*3/uL (ref 150–400)
RBC: 4.66 MIL/uL (ref 3.87–5.11)
RDW: 12.3 % (ref 11.5–15.5)
WBC: 11 10*3/uL — ABNORMAL HIGH (ref 4.0–10.5)
nRBC: 0 % (ref 0.0–0.2)

## 2021-08-19 LAB — BASIC METABOLIC PANEL
Anion gap: 10 (ref 5–15)
BUN: 21 mg/dL (ref 8–23)
CO2: 24 mmol/L (ref 22–32)
Calcium: 10 mg/dL (ref 8.9–10.3)
Chloride: 107 mmol/L (ref 98–111)
Creatinine, Ser: 0.99 mg/dL (ref 0.44–1.00)
GFR, Estimated: >60 mL/min (ref >60)
Glucose, Bld: 120 mg/dL — ABNORMAL HIGH (ref 70–99)
Potassium: 3.8 mmol/L (ref 3.5–5.1)
Sodium: 141 mmol/L (ref 135–145)

## 2021-08-19 MED ORDER — PROPOFOL 10 MG/ML IV BOLUS
INTRAVENOUS | Status: AC | PRN
  Administered 2021-08-19: 30 mg via INTRAVENOUS
  Administered 2021-08-19: 50 mg via INTRAVENOUS
  Administered 2021-08-19: 20 mg via INTRAVENOUS
  Administered 2021-08-19: 30 mg via INTRAVENOUS

## 2021-08-19 MED ORDER — HYDROMORPHONE HCL 1 MG/ML IJ SOLN
1.0000 mg | Freq: Once | INTRAMUSCULAR | Status: AC
  Administered 2021-08-19: 1 mg via INTRAVENOUS
  Filled 2021-08-19: qty 1

## 2021-08-19 MED ORDER — PROPOFOL 10 MG/ML IV BOLUS
200.0000 mg | Freq: Once | INTRAVENOUS | Status: AC
  Administered 2021-08-19: 50 mg via INTRAVENOUS
  Filled 2021-08-19: qty 20

## 2021-08-19 NOTE — Sedation Documentation (Signed)
Propfol 10mg /ml ordered by Dr. to be pushed at his verbal orders at bedside. RN  gave 50mg  pushed at 2144- pt still exhibiting signs of discomfort when hip moved such as moaning. 30mg  pushed at 2145- pt not relaxed and hip tense 20mg  pushed at 2146 by this RN. Hip reduced and pt sedated. RT at bedside. Pts sats remained stable throughout sedation. Knee immobilizer placed. Xray called.

## 2021-08-19 NOTE — Discharge Instructions (Signed)
Continue taking your oxycodone as prescribed by your orthopedic doctor  Please use knee immobilizer and use crutches  See your orthopedic doctor for follow-up  Return to ER if you have another hip dislocation, severe hip pain

## 2021-08-19 NOTE — ED Provider Notes (Signed)
  Procedures  .Ortho Injury Treatment  Date/Time: 08/19/2021 10:08 PM Performed by: Cristopher Peru, PA-C Authorized by: Cristopher Peru, PA-C   Consent:    Consent obtained:  Verbal and written   Consent given by:  Patient   Risks discussed:  Recurrent dislocation   Alternatives discussed:  Delayed treatment Universal protocol:    Procedure explained and questions answered to patient or proxy's satisfaction: yes     Relevant documents present and verified: yes     Test results available and properly labeled: yes     Imaging studies available: yes     Required blood products, implants, devices, and special equipment available: yes     Site/side marked: yes     Immediately prior to procedure a time out was called: yes     Patient identity confirmed:  Verbally with patient and arm bandInjury location: hip Location details: right hip Injury type: dislocation Dislocation type: posterior Spontaneous dislocation: yes Prosthesis: yes Pre-procedure neurovascular assessment: neurovascularly intact Pre-procedure distal perfusion: normal Pre-procedure neurological function: normal Pre-procedure range of motion: reduced  Anesthesia: Local anesthesia used: no  Patient sedated: Yes. Refer to sedation procedure documentation for details of sedation. Manipulation performed: yes Reduction method: extension and external rotation Reduction successful: yes X-ray confirmed reduction: yes Immobilization: knee immoblizer. Splint type: knee immobilizer. Splint Applied by: ED Provider Supplies used: cotton padding Post-procedure neurovascular assessment: post-procedure neurovascularly intact Post-procedure distal perfusion: normal Post-procedure neurological function: normal Post-procedure range of motion: normal            Cristopher Peru, PA-C 08/19/21 2209    Charlynne Pander, MD 08/19/21 2245

## 2021-08-19 NOTE — ED Triage Notes (Signed)
Patient presents due to right hip dislocation today. Patient has a history of hip dislocation, this being her third time. Total of 150 mcg of Fentanyl was given by EMS.    EMS vitals: 158/84 BP 92 HR 22 RR 100% SPO2 on room air

## 2021-08-19 NOTE — ED Notes (Signed)
Pt is drinking applej uice- sating 100% on RA, alert and talking to husband at bedside.

## 2021-08-19 NOTE — ED Provider Notes (Signed)
Natasha Hunter Provider Note   CSN: [id number] Arrival date & time: 08/19/21  2036     History  Chief Complaint  Patient presents with   Hip Pain    Natasha Hunter is a 66 y.o. female history of hypertension, [facility], here presenting with right hip pain and possible dislocation.  Patient states that she was having sex and was bending over her husband's shoulder and felt a pop in the right hip.  She states that this is her third dislocation.  Denies any trauma or injury  The history is provided by the patient.      Home Medications Prior to Admission medications   Medication Sig Start Date End Date Taking? Authorizing Provider  acetaminophen (TYLENOL) 325 MG tablet Take 3 tablets (975 mg total) by mouth every 6 (six) hours. 07/07/21   Cassandria Angeronovan, Ashley R, PA-C  acyclovir (ZOVIRAX) 400 MG tablet Take 400 mg by mouth 2 (two) times daily. 08/05/20   [provider]  albuterol (VENTOLIN HFA) 108 (90 Base) MCG/ACT inhaler Inhale 1 puff into the lungs every 6 (six) hours as needed for wheezing.    [provider]  amLODipine (NORVASC) 10 MG tablet Take 10 mg by mouth daily. 04/13/20   [provider]  atenolol (TENORMIN) 50 MG tablet Take 50 mg by mouth 2 (two) times daily.     [provider]  docusate sodium (COLACE) 100 MG capsule Take 1 capsule (100 mg total) by mouth 2 (two) times daily. 07/07/21   Cassandria Angeronovan, Ashley R, PA-C  famotidine (PEPCID) 20 MG tablet Take 20 mg by mouth daily.    [provider]  methocarbamol (ROBAXIN) 500 MG tablet Take 1 tablet (500 mg total) by mouth every 6 (six) hours as needed for muscle spasms. 07/07/21   Cassandria Angeronovan, Ashley R, PA-C  Multiple Vitamins-Minerals (IMMUNE SUPPORT PO) Take 1 tablet by mouth daily. Immune 24    [provider]  naloxone (NARCAN) nasal spray 4 mg/0.1 mL Spray into one nostril with signs of  over-sedation/overdose, then call 911. If no response then may spray into other nostril in 2 minutes 07/07/21   Cassandria Angeronovan, Ashley R, PA-C  oxyCODONE (OXY IR/ROXICODONE) 5 MG immediate release tablet Take 1-2 tablets (5-10 mg total) by mouth every 4 (four) hours as needed for severe pain. 07/07/21   Cassandria Angeronovan, Ashley R, PA-C  polyethylene glycol (MIRALAX / GLYCOLAX) 17 g packet Take 17 g by mouth daily as needed for mild constipation. 07/07/21   Cassandria Angeronovan, Ashley R, PA-C  rosuvastatin (CRESTOR) 10 MG tablet Take 10 mg by mouth at bedtime.    [provider]  traZODone (DESYREL) 150 MG tablet Take 150 mg by mouth at bedtime.    [provider]      Allergies    Darvon [propoxyphene], Sulfa antibiotics, Broccoli [brassica oleracea], and Cabbage    Review of Systems   Review of Systems  Musculoskeletal:        R hip pain   All other systems reviewed and are negative.  Physical Exam Updated Vital Signs BP (!) 131/93 (BP Location: Right Arm)   Pulse 92   Temp 98.6 F (37 C) (Oral)   Resp 18   Wt 79.4 kg   SpO2 100%   BMI 25.11 kg/m  Physical Exam Vitals and nursing note reviewed.  Constitutional:      Comments: Uncomfortable  HENT:     Head: Normocephalic.  Nose: Nose normal.     Mouth/Throat:     Mouth: Mucous membranes are moist.  Eyes:     Extraocular Movements: Extraocular movements intact.     Pupils: Pupils are equal, round, and reactive to light.  Cardiovascular:     Rate and Rhythm: Normal rate and regular rhythm.     Pulses: Normal pulses.     Heart sounds: Normal heart sounds.  Pulmonary:     Effort: Pulmonary effort is normal.     Breath sounds: Normal breath sounds.  Abdominal:     General: Abdomen is flat.     Palpations: Abdomen is soft.  Musculoskeletal:     Cervical back: Normal range of motion and neck supple.     Comments: R hip with obvious shortening and possible dislocation.  There is previous surgical scars  Skin:    General: Skin is warm.      Capillary Refill: Capillary refill takes less than 2 seconds.  Neurological:     General: No focal deficit present.     Mental Status: She is oriented to person, place, and time.  Psychiatric:        Mood and Affect: Mood normal.        Behavior: Behavior normal.    ED Results / Procedures / Treatments   Labs (all labs ordered are listed, but only abnormal results are displayed) Labs Reviewed  CBC WITH DIFFERENTIAL/PLATELET  BASIC METABOLIC PANEL    EKG None  Radiology No results found.  Procedures .Sedation  Date/Time: 08/19/2021 10:30 PM Performed by: Charlynne Pander, MD Authorized by: Charlynne Pander, MD   Consent:    Consent obtained:  Verbal Universal protocol:    Immediately prior to procedure, a time out was called: yes   Pre-sedation assessment:    Time since last food or drink:  6 hours   ASA classification: class 1 - normal, healthy patient     Mallampati score:  I - soft palate, uvula, fauces, pillars visible   Pre-sedation assessments completed and reviewed: airway patency, mental status and respiratory function   Immediate pre-procedure details:    Reviewed: vital signs   Procedure details (see MAR for exact dosages):    Preoxygenation:  Room air   Total Provider sedation time (minutes):  30 Post-procedure details:    Recovery: Patient returned to pre-procedure baseline      Medications Ordered in ED Medications  propofol (DIPRIVAN) 10 mg/mL bolus/IV push 200 mg (has no administration in time range)  HYDROmorphone (DILAUDID) injection 1 mg (1 mg Intravenous Given 08/19/21 2105)    ED Course/ Medical Decision Making/ A&P                           Medical Decision Making [name] is a 66 y.o. female here presenting with right hip pain.  Concern for possible dislocation as patient has previous hip surgery with recurrent dislocation.  We will get right hip x-ray.  Patient likely will need conscious sedation for hip  reduction  10:27 PM Initial xray showed superior dislocation of the femoral head.  I performed the sedation while my PA, Lauren performed reduction.  Postreduction x-ray showed successful reduction of the dislocation.  Patient is now awake and alert.  At this point she is stable for discharge  Problems Addressed: Dislocation of right hip, initial encounter [facility]): acute illness or injury  Amount and/or Complexity of Data Reviewed Labs: ordered. Decision-making details documented  in ED Course. Radiology: ordered and independent interpretation performed. Decision-making details documented in ED Course.  Risk Prescription drug management.    Final Clinical Impression(s) / ED Diagnoses Final diagnoses:  None    Rx / DC Orders ED Discharge Orders     None         Charlynne Pander, MD 08/19/21 2235

## 2021-08-23 DIAGNOSIS — Z96641 Presence of right artificial hip joint: Secondary | ICD-10-CM | POA: Diagnosis not present

## 2021-08-23 DIAGNOSIS — Z96652 Presence of left artificial knee joint: Secondary | ICD-10-CM | POA: Diagnosis not present

## 2021-08-23 DIAGNOSIS — Z471 Aftercare following joint replacement surgery: Secondary | ICD-10-CM | POA: Diagnosis not present

## 2021-09-04 DIAGNOSIS — M25662 Stiffness of left knee, not elsewhere classified: Secondary | ICD-10-CM | POA: Diagnosis not present

## 2021-09-04 DIAGNOSIS — M25562 Pain in left knee: Secondary | ICD-10-CM | POA: Diagnosis not present

## 2021-10-11 DIAGNOSIS — G5601 Carpal tunnel syndrome, right upper limb: Secondary | ICD-10-CM | POA: Diagnosis not present

## 2021-10-11 DIAGNOSIS — Z96641 Presence of right artificial hip joint: Secondary | ICD-10-CM | POA: Diagnosis not present

## 2021-10-11 DIAGNOSIS — M25551 Pain in right hip: Secondary | ICD-10-CM | POA: Diagnosis not present

## 2021-11-24 DIAGNOSIS — Z96652 Presence of left artificial knee joint: Secondary | ICD-10-CM | POA: Diagnosis not present

## 2021-11-24 DIAGNOSIS — Z96643 Presence of artificial hip joint, bilateral: Secondary | ICD-10-CM | POA: Diagnosis not present

## 2021-11-24 DIAGNOSIS — M25552 Pain in left hip: Secondary | ICD-10-CM | POA: Diagnosis not present

## 2021-12-26 DIAGNOSIS — I10 Essential (primary) hypertension: Secondary | ICD-10-CM | POA: Diagnosis not present

## 2021-12-26 DIAGNOSIS — G479 Sleep disorder, unspecified: Secondary | ICD-10-CM | POA: Diagnosis not present

## 2021-12-26 DIAGNOSIS — E782 Mixed hyperlipidemia: Secondary | ICD-10-CM | POA: Diagnosis not present

## 2021-12-26 DIAGNOSIS — Z23 Encounter for immunization: Secondary | ICD-10-CM | POA: Diagnosis not present

## 2022-02-14 DIAGNOSIS — I10 Essential (primary) hypertension: Secondary | ICD-10-CM | POA: Diagnosis not present

## 2022-02-14 DIAGNOSIS — K5900 Constipation, unspecified: Secondary | ICD-10-CM | POA: Diagnosis not present

## 2022-03-05 DIAGNOSIS — H43813 Vitreous degeneration, bilateral: Secondary | ICD-10-CM | POA: Diagnosis not present

## 2022-03-09 DIAGNOSIS — M79672 Pain in left foot: Secondary | ICD-10-CM | POA: Diagnosis not present

## 2022-03-16 DIAGNOSIS — M79672 Pain in left foot: Secondary | ICD-10-CM | POA: Diagnosis not present

## 2022-03-16 DIAGNOSIS — S92515A Nondisplaced fracture of proximal phalanx of left lesser toe(s), initial encounter for closed fracture: Secondary | ICD-10-CM | POA: Diagnosis not present

## 2022-06-04 ENCOUNTER — Other Ambulatory Visit: Payer: Self-pay | Admitting: Family Medicine

## 2022-06-04 DIAGNOSIS — Z1231 Encounter for screening mammogram for malignant neoplasm of breast: Secondary | ICD-10-CM

## 2022-08-13 ENCOUNTER — Ambulatory Visit
Admission: RE | Admit: 2022-08-13 | Discharge: 2022-08-13 | Disposition: A | Discharge status: Home / Self Care | Payer: Medicare HMO | Source: Ambulatory Visit | Attending: Family Medicine | Admitting: Family Medicine

## 2022-08-13 DIAGNOSIS — Z1231 Encounter for screening mammogram for malignant neoplasm of breast: Secondary | ICD-10-CM

## 2023-04-11 DIAGNOSIS — H16223 Keratoconjunctivitis sicca, not specified as Sjogren's, bilateral: Secondary | ICD-10-CM | POA: Diagnosis not present

## 2023-05-03 DIAGNOSIS — F4323 Adjustment disorder with mixed anxiety and depressed mood: Secondary | ICD-10-CM | POA: Diagnosis not present

## 2023-05-13 DIAGNOSIS — H16223 Keratoconjunctivitis sicca, not specified as Sjogren's, bilateral: Secondary | ICD-10-CM | POA: Diagnosis not present

## 2023-05-22 DIAGNOSIS — Z202 Contact with and (suspected) exposure to infections with a predominantly sexual mode of transmission: Secondary | ICD-10-CM | POA: Diagnosis not present

## 2023-05-22 DIAGNOSIS — Z7721 Contact with and (suspected) exposure to potentially hazardous body fluids: Secondary | ICD-10-CM | POA: Diagnosis not present

## 2023-05-24 DIAGNOSIS — A6000 Herpesviral infection of urogenital system, unspecified: Secondary | ICD-10-CM | POA: Diagnosis not present

## 2023-05-24 DIAGNOSIS — Z113 Encounter for screening for infections with a predominantly sexual mode of transmission: Secondary | ICD-10-CM | POA: Diagnosis not present

## 2023-07-17 ENCOUNTER — Other Ambulatory Visit: Payer: Self-pay | Admitting: Family Medicine

## 2023-07-17 DIAGNOSIS — Z1231 Encounter for screening mammogram for malignant neoplasm of breast: Secondary | ICD-10-CM

## 2023-07-31 DIAGNOSIS — N3281 Overactive bladder: Secondary | ICD-10-CM | POA: Diagnosis not present

## 2023-07-31 DIAGNOSIS — R350 Frequency of micturition: Secondary | ICD-10-CM | POA: Diagnosis not present

## 2023-07-31 DIAGNOSIS — R3129 Other microscopic hematuria: Secondary | ICD-10-CM | POA: Diagnosis not present

## 2023-08-08 ENCOUNTER — Encounter (HOSPITAL_COMMUNITY): Payer: Self-pay

## 2023-08-15 ENCOUNTER — Ambulatory Visit
Admission: RE | Admit: 2023-08-15 | Discharge: 2023-08-15 | Disposition: A | Discharge status: Home / Self Care | Source: Ambulatory Visit | Attending: Family Medicine | Admitting: Family Medicine

## 2023-08-15 DIAGNOSIS — Z1231 Encounter for screening mammogram for malignant neoplasm of breast: Secondary | ICD-10-CM | POA: Diagnosis not present

## 2023-09-04 DIAGNOSIS — Z471 Aftercare following joint replacement surgery: Secondary | ICD-10-CM | POA: Diagnosis not present

## 2023-09-04 DIAGNOSIS — Z96641 Presence of right artificial hip joint: Secondary | ICD-10-CM | POA: Diagnosis not present

## 2023-09-11 DIAGNOSIS — Z0000 Encounter for general adult medical examination without abnormal findings: Secondary | ICD-10-CM | POA: Diagnosis not present

## 2023-09-11 DIAGNOSIS — I700 Atherosclerosis of aorta: Secondary | ICD-10-CM | POA: Diagnosis not present

## 2023-09-11 DIAGNOSIS — Z1331 Encounter for screening for depression: Secondary | ICD-10-CM | POA: Diagnosis not present

## 2023-09-11 DIAGNOSIS — G4700 Insomnia, unspecified: Secondary | ICD-10-CM | POA: Diagnosis not present

## 2023-09-11 DIAGNOSIS — I10 Essential (primary) hypertension: Secondary | ICD-10-CM | POA: Diagnosis not present

## 2023-09-11 DIAGNOSIS — K5901 Slow transit constipation: Secondary | ICD-10-CM | POA: Diagnosis not present

## 2023-09-11 DIAGNOSIS — E782 Mixed hyperlipidemia: Secondary | ICD-10-CM | POA: Diagnosis not present

## 2023-09-11 DIAGNOSIS — A6004 Herpesviral vulvovaginitis: Secondary | ICD-10-CM | POA: Diagnosis not present

## 2023-11-06 ENCOUNTER — Ambulatory Visit: Admitting: Family Medicine

## 2023-11-20 DIAGNOSIS — H2513 Age-related nuclear cataract, bilateral: Secondary | ICD-10-CM | POA: Diagnosis not present

## 2023-11-20 DIAGNOSIS — H16223 Keratoconjunctivitis sicca, not specified as Sjogren's, bilateral: Secondary | ICD-10-CM | POA: Diagnosis not present

## 2023-12-12 ENCOUNTER — Other Ambulatory Visit: Payer: Self-pay | Admitting: Family Medicine

## 2023-12-12 DIAGNOSIS — Z1231 Encounter for screening mammogram for malignant neoplasm of breast: Secondary | ICD-10-CM

## 2023-12-23 DIAGNOSIS — H524 Presbyopia: Secondary | ICD-10-CM | POA: Diagnosis not present

## 2023-12-23 DIAGNOSIS — H5213 Myopia, bilateral: Secondary | ICD-10-CM | POA: Diagnosis not present

## 2023-12-23 DIAGNOSIS — I10 Essential (primary) hypertension: Secondary | ICD-10-CM | POA: Diagnosis not present

## 2023-12-23 DIAGNOSIS — H52221 Regular astigmatism, right eye: Secondary | ICD-10-CM | POA: Diagnosis not present

## 2023-12-23 DIAGNOSIS — H2513 Age-related nuclear cataract, bilateral: Secondary | ICD-10-CM | POA: Diagnosis not present

## 2023-12-23 DIAGNOSIS — H16223 Keratoconjunctivitis sicca, not specified as Sjogren's, bilateral: Secondary | ICD-10-CM | POA: Diagnosis not present

## 2024-01-01 ENCOUNTER — Ambulatory Visit
Admission: RE | Admit: 2024-01-01 | Discharge: 2024-01-01 | Disposition: A | Source: Ambulatory Visit | Attending: Family Medicine | Admitting: Family Medicine

## 2024-01-01 DIAGNOSIS — Z1231 Encounter for screening mammogram for malignant neoplasm of breast: Secondary | ICD-10-CM | POA: Diagnosis not present

## 2024-02-17 DIAGNOSIS — M542 Cervicalgia: Secondary | ICD-10-CM | POA: Diagnosis not present
